# Patient Record
Sex: Female | Born: 1995 | Race: Black or African American | Hispanic: No | Marital: Single | Smoking: Never smoker
Health system: Southern US, Community
[De-identification: ages and names within clinical notes are randomized; demographics above are authoritative.]

## PROBLEM LIST (undated history)

## (undated) DIAGNOSIS — J45909 Unspecified asthma, uncomplicated: Secondary | ICD-10-CM

---
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
## 2016-11-02 ENCOUNTER — Emergency Department (HOSPITAL_COMMUNITY)
Admission: EM | Admit: 2016-11-02 | Discharge: 2016-11-03 | Disposition: A | Discharge status: Home / Self Care | Payer: No Typology Code available for payment source | Attending: Emergency Medicine | Admitting: Emergency Medicine

## 2016-11-02 ENCOUNTER — Emergency Department (HOSPITAL_COMMUNITY): Payer: No Typology Code available for payment source

## 2016-11-02 ENCOUNTER — Encounter (HOSPITAL_COMMUNITY): Payer: Self-pay | Admitting: Emergency Medicine

## 2016-11-02 DIAGNOSIS — R509 Fever, unspecified: Secondary | ICD-10-CM | POA: Insufficient documentation

## 2016-11-02 DIAGNOSIS — Z79899 Other long term (current) drug therapy: Secondary | ICD-10-CM | POA: Insufficient documentation

## 2016-11-02 DIAGNOSIS — R69 Illness, unspecified: Secondary | ICD-10-CM

## 2016-11-02 DIAGNOSIS — R05 Cough: Secondary | ICD-10-CM

## 2016-11-02 DIAGNOSIS — R059 Cough, unspecified: Secondary | ICD-10-CM

## 2016-11-02 DIAGNOSIS — J45901 Unspecified asthma with (acute) exacerbation: Secondary | ICD-10-CM | POA: Insufficient documentation

## 2016-11-02 DIAGNOSIS — J111 Influenza due to unidentified influenza virus with other respiratory manifestations: Secondary | ICD-10-CM | POA: Insufficient documentation

## 2016-11-02 MED ORDER — KETOROLAC TROMETHAMINE 30 MG/ML IJ SOLN
30.0000 mg | Freq: Once | INTRAMUSCULAR | Status: AC
  Administered 2016-11-02: 30 mg via INTRAVENOUS
  Filled 2016-11-02: qty 1

## 2016-11-02 MED ORDER — LEVALBUTEROL HCL 1.25 MG/0.5ML IN NEBU
1.2500 mg | INHALATION_SOLUTION | Freq: Once | RESPIRATORY_TRACT | Status: AC
  Administered 2016-11-02: 1.25 mg via RESPIRATORY_TRACT
  Filled 2016-11-02: qty 0.5

## 2016-11-02 MED ORDER — SODIUM CHLORIDE 0.9 % IV BOLUS (SEPSIS)
1000.0000 mL | Freq: Once | INTRAVENOUS | Status: AC
  Administered 2016-11-02: 1000 mL via INTRAVENOUS

## 2016-11-02 MED ORDER — ALBUTEROL SULFATE HFA 108 (90 BASE) MCG/ACT IN AERS
2.0000 | INHALATION_SPRAY | Freq: Once | RESPIRATORY_TRACT | Status: AC
  Administered 2016-11-02: 2 via RESPIRATORY_TRACT
  Filled 2016-11-02: qty 6.7

## 2016-11-02 MED ORDER — ALBUTEROL SULFATE HFA 108 (90 BASE) MCG/ACT IN AERS: 2.0000 | INHALATION_SPRAY | RESPIRATORY_TRACT | 0 refills | Status: DC | PRN

## 2016-11-02 MED ORDER — IPRATROPIUM BROMIDE 0.02 % IN SOLN
0.5000 mg | Freq: Once | RESPIRATORY_TRACT | Status: AC
  Administered 2016-11-02: 0.5 mg via RESPIRATORY_TRACT
  Filled 2016-11-02: qty 2.5

## 2016-11-02 MED ORDER — ALBUTEROL SULFATE (2.5 MG/3ML) 0.083% IN NEBU
5.0000 mg | INHALATION_SOLUTION | Freq: Once | RESPIRATORY_TRACT | Status: DC
  Filled 2016-11-02: qty 6

## 2016-11-02 MED ORDER — OSELTAMIVIR PHOSPHATE 75 MG PO CAPS: 75.0000 mg | ORAL_CAPSULE | Freq: Two times a day (BID) | ORAL | 0 refills | Status: DC

## 2016-11-02 MED ORDER — PREDNISONE 20 MG PO TABS
60.0000 mg | ORAL_TABLET | Freq: Once | ORAL | Status: AC
  Administered 2016-11-02: 60 mg via ORAL
  Filled 2016-11-02: qty 3

## 2016-11-02 MED ORDER — PREDNISONE 20 MG PO TABS: ORAL_TABLET | ORAL | 0 refills | Status: DC

## 2016-11-02 MED ORDER — ACETAMINOPHEN 500 MG PO TABS
1000.0000 mg | ORAL_TABLET | Freq: Once | ORAL | Status: AC
  Administered 2016-11-02: 1000 mg via ORAL
  Filled 2016-11-02: qty 2

## 2016-11-02 NOTE — ED Notes (Signed)
Patient transported to X-ray 

## 2016-11-02 NOTE — Discharge Instructions (Signed)
Continue to stay well-hydrated. Continue to alternate between Tylenol and Ibuprofen for pain or fever. Use Mucinex for cough suppression/expectoration of mucus. Use netipot and flonase to help with nasal congestion. Take over-the-counter Benadryl or other daily antihistamine to decrease secretions and for help with your symptoms, and to help with your asthma exacerbation. Use inhaler as directed, as needed for cough/chest congestion/wheezing/shortness of breath. Take prednisone as directed until completed, starting 11/03/16 since you were given today's dose in the ER. Your symptoms are consistent with the flu virus, take tamiflu as directed until completed which can potentially help decrease severity and duration of symptoms if your illness is caused by the flu; if it is a different viral illness, tamiflu may not do much, but viral illnesses typically self resolve with time. Follow up with your primary care doctor in 3-5 days for recheck of ongoing symptoms. Return to emergency department for emergent changing or worsening of symptoms.

## 2016-11-02 NOTE — ED Provider Notes (Signed)
[facility]-EMERGENCY DEPT Provider Note   CSN: [id number] Arrival date & time: 11/02/16  2009     History   Chief Complaint Chief Complaint  Patient presents with  . Fever  . Shortness of Breath    HPI Debra Gardner is a 21 y.o. female with a PMHx of asthma, who presents to the ED with complaints of flulike symptoms that began yesterday, triggering an asthma exacerbation. Patient states that yesterday she developed fever with Tmax 103, as well as chest congestion and tightness, dry cough, generalized body aches, shortness of breath, and wheezing. She took Alka-Seltzer and Tylenol around noon which helped, then took 600 mg ibuprofen around 6 PM which also helped with her fever and body aches. She ran out of her inhalers yesterday so she was unable to use anything for her shortness of breath, and bruit she was given 10 mg albuterol and 0.5 mg Atrovent which she states helped significantly. No known aggravating factors. States that her symptoms feel consistent with her prior asthma exacerbations. Multiple +sick contacts at school. Did not receive the flu shot this year, was going to get it today at the campus clinic. She is a nonsmoker, no family hx of cardiac disease.  She denies rhinorrhea, sore throat, ear pain/drainage, hemoptysis, CP aside from tightness, LE swelling, recent travel/surg/immobilization, estrogen use, personal/family hx of DVT/PE, abd pain, N/V/D/C, hematuria, dysuria, numbness, tingling, focal weakness, diaphoresis, lightheadedness, claudication, orthopnea, or any other complaints at this time. She has previously needed steroids when she has asthma exacerbations.    The history is provided by the patient and medical records. No language interpreter was used.  Fever   Associated symptoms include cough. Pertinent negatives include no chest pain, no diarrhea, no vomiting and no sore throat.  Shortness of Breath  Associated symptoms include a fever (Tmax 103), cough and wheezing.  Pertinent negatives include no rhinorrhea, no sore throat, no ear pain, no chest pain, no vomiting, no abdominal pain and no leg swelling.  URI   This is a new problem. The current episode started yesterday. The problem has not changed since onset.The maximum temperature recorded prior to her arrival was 103 to 104 F. The fever has been present for less than 1 day. Associated symptoms include cough and wheezing. Pertinent negatives include no chest pain, no abdominal pain, no diarrhea, no nausea, no vomiting, no dysuria, no ear pain, no rhinorrhea and no sore throat. She has tried other medications (alka seltzer, tylenol, ibuprofen, and albuterol 10mg /Atrovent 0.5mg  en route) for the symptoms. The treatment provided moderate relief.    Past Medical History:  Diagnosis Date  . Asthma     There are no active problems to display for this patient.   No past surgical history on file.  OB History    No data available       Home Medications    Prior to Admission medications   Medication Sig Start Date End Date Taking? Authorizing Provider  albuterol (PROVENTIL HFA;VENTOLIN HFA) 108 (90 BASE) MCG/ACT inhaler Inhale 2 puffs into the lungs every 6 (six) hours as needed for wheezing or shortness of breath. 02/04/15   Jennifer Piepenbrink, PA-C  beclomethasone (QVAR) 40 MCG/ACT inhaler Inhale 1 puff into the lungs 2 (two) times daily. Patient taking differently: Inhale 2 puffs into the lungs 2 (two) times daily.  05/26/12 10/01/15  Tamika Bush, DO  benzonatate (TESSALON) 100 MG capsule Take 1 capsule (100 mg total) by mouth every 8 (eight) hours. 10/01/15   Shawn  C Joy, PA-C  cetirizine (ZYRTEC) 10 MG tablet Take 10 mg by mouth daily.    Historical Provider, MD  dextromethorphan-guaiFENesin (MUCINEX DM) 30-600 MG 12hr tablet Take 1 tablet by mouth 2 (two) times daily. 10/01/15   Shawn C Joy, PA-C  EPINEPHrine (EPIPEN 2-PAK) 0.3 mg/0.3 mL IJ SOAJ injection Inject 0.3 mLs (0.3 mg total) into the  muscle once as needed (for severe allergic reaction). CAll 911 immediately if you have to use this medicine 02/04/15   Francee Piccolo, PA-C  fluticasone (FLONASE) 50 MCG/ACT nasal spray Place 2 sprays into the nose daily. Patient taking differently: Place 2 sprays into the nose daily as needed for allergies.  05/26/12 10/01/15  Tamika Bush, DO  ibuprofen (ADVIL,MOTRIN) 800 MG tablet Take 1 tablet (800 mg total) by mouth 3 (three) times daily. 10/01/15   Shawn C Joy, PA-C  montelukast (SINGULAIR) 10 MG tablet Take 10 mg by mouth at bedtime.    Historical Provider, MD  ondansetron (ZOFRAN ODT) 4 MG disintegrating tablet Take 1 tablet (4 mg total) by mouth every 8 (eight) hours as needed for nausea or vomiting. 10/01/15   Anselm Pancoast, PA-C    Family History No family history on file.  Social History Social History  Substance Use Topics  . Smoking status: Never Smoker  . Smokeless tobacco: Not on file  . Alcohol use No     Allergies   Dairy aid [lactase] and Wheat bran   Review of Systems Review of Systems  Constitutional: Positive for fever (Tmax 103). Negative for diaphoresis.  HENT: Negative for ear discharge, ear pain, rhinorrhea and sore throat.   Respiratory: Positive for cough, chest tightness, shortness of breath and wheezing.   Cardiovascular: Negative for chest pain and leg swelling.  Gastrointestinal: Negative for abdominal pain, constipation, diarrhea, nausea and vomiting.  Genitourinary: Negative for dysuria and hematuria.  Musculoskeletal: Positive for myalgias (body aches). Negative for arthralgias.  Skin: Negative for color change.  Allergic/Immunologic: Negative for immunocompromised state.  Neurological: Negative for weakness, light-headedness and numbness.  Psychiatric/Behavioral: Negative for confusion.   10 Systems reviewed and are negative for acute change except as noted in the HPI.   Physical Exam Updated Vital Signs BP 124/62 (BP Location: Right Arm)    Pulse (!) 144   Temp 100.9 F (38.3 C) (Oral)   Resp 21   Ht 5\' 2"  (1.575 m)   Wt 72.6 kg   SpO2 95%   BMI 29.26 kg/m   Physical Exam  Constitutional: She is oriented to person, place, and time. She appears well-developed and well-nourished.  Non-toxic appearance. No distress.  Febrile 100.22F but overall nontoxic, NAD, fairly well appearing, not septic appearing  HENT:  Head: Normocephalic and atraumatic.  Mouth/Throat: Oropharynx is clear and moist and mucous membranes are normal.  Eyes: Conjunctivae and EOM are normal. Right eye exhibits no discharge. Left eye exhibits no discharge.  Neck: Normal range of motion. Neck supple.  Cardiovascular: Regular rhythm, normal heart sounds and intact distal pulses.  Tachycardia present.  Exam reveals no gallop and no friction rub.   No murmur heard. Tachycardic in the 140s, reg rhythm, nl s1/s2, no m/r/g, distal pulses intact, no pedal edema   Pulmonary/Chest: Effort normal. No respiratory distress. She has decreased breath sounds in the right lower field. She has no wheezes. She has no rhonchi. She has no rales. She exhibits tenderness. She exhibits no crepitus, no deformity and no retraction.  Slightly diminished lung sounds in RLL but  otherwise overall CTAB in all other lung fields, no w/r/r audible, no hypoxia or increased WOB, speaking in full sentences, SpO2 95% on RA Chest wall mildly TTP diffusely across anterior chest, without crepitus, deformities, or retractions   Abdominal: Soft. Normal appearance and bowel sounds are normal. She exhibits no distension. There is no tenderness. There is no rigidity, no rebound, no guarding, no CVA tenderness, no tenderness at McBurney's point and negative Murphy's sign.  Musculoskeletal: Normal range of motion.  MAE x4 Strength and sensation grossly intact in all extremities Distal pulses intact Gait steady No pedal edema, neg homan's bilaterally   Neurological: She is alert and oriented to person,  place, and time. She has normal strength. No sensory deficit.  Skin: Skin is warm, dry and intact. No rash noted.  Psychiatric: She has a normal mood and affect.  Nursing note and vitals reviewed.    ED Treatments / Results  Labs (all labs ordered are listed, but only abnormal results are displayed) Labs Reviewed - No data to display  EKG  EKG Interpretation  Date/Time:  Thursday November 02 2016 20:30:36 EST Ventricular Rate:  140 PR Interval:    QRS Duration: 78 QT Interval:  302 QTC Calculation: 461 R Axis:   55 Text Interpretation:  Sinus tachycardia Multiple ventricular premature complexes Low voltage, precordial leads Nonspecific repol abnormality, diffuse leads Confirmed by Ranae Palms  MD, DAVID ([id number]) on 11/02/2016 9:25:06 PM       Radiology Dg Chest 2 View  Result Date: 11/02/2016 CLINICAL DATA:  21 year old female with cough and fever. History of asthma. EXAM: CHEST  2 VIEW COMPARISON:  Chest radiograph dated 09/30/2015 FINDINGS: Two views of the chest demonstrate an area of increased linear density involving the right middle lobe, seen on the lateral projection. This may represent atelectatic changes versus an area of vascular crowding. No corresponding density noted on the frontal projection. There is no silhouetting of the diaphragm over the right cardiac border. The left lung is clear. There is no pleural effusion or pneumothorax. The cardiac silhouette is within normal limits. No acute osseous pathology. IMPRESSION: Linear density over the cardiac border on the lateral projection may represent subsegmental atelectasis in the right middle lobe. Infiltrate is less likely but not excluded. Clinical correlation is recommended. Electronically Signed   By: Elgie Collard M.D.   On: 11/02/2016 21:30    Procedures Procedures (including critical care time)  Medications Ordered in ED Medications  sodium chloride 0.9 % bolus 1,000 mL (0 mLs Intravenous Stopped 11/02/16 2334)    acetaminophen (TYLENOL) tablet 1,000 mg (1,000 mg Oral Given 11/02/16 2101)  ketorolac (TORADOL) 30 MG/ML injection 30 mg (30 mg Intravenous Given 11/02/16 2143)  ipratropium (ATROVENT) nebulizer solution 0.5 mg (0.5 mg Nebulization Given 11/02/16 2132)  predniSONE (DELTASONE) tablet 60 mg (60 mg Oral Given 11/02/16 2101)  levalbuterol (XOPENEX) nebulizer solution 1.25 mg (1.25 mg Nebulization Given 11/02/16 2131)  albuterol (PROVENTIL HFA;VENTOLIN HFA) 108 (90 Base) MCG/ACT inhaler 2 puff (2 puffs Inhalation Given 11/02/16 2239)  sodium chloride 0.9 % bolus 1,000 mL (0 mLs Intravenous Stopped 11/02/16 2335)     Initial Impression / Assessment and Plan / ED Course  I have reviewed the triage vital signs and the nursing notes.  Pertinent labs & imaging results that were available during my care of the patient were reviewed by me and considered in my medical decision making (see chart for details).     21 y.o. female here with flu like illness x1  day that has triggered an asthma exacerbation. Fever Tmax 103 along with dry cough, chest congestion and tightness, wheezing, body aches, and SOB beginning yesterday. +Sick contacts. Given 10mg  albuterol and 0.5mg  atrovent en route, likely contributing to her tachycardia; arrives mildly febrile 100.9, tachycardic in the 140s, but overall appears well, smiling and in NAD, DOES NOT APPEAR SEPTIC. Doubt PE as cause of tachycardia. Lung sounds actually better than anticipated, mildly diminished in RLL but otherwise no wheezing/rhonchi. Pt feels like she would do well with another duoneb (xopenex instead of albuterol), which I feel is reasonable. Will give fluids as well, in addition to tylenol 1000mg , toradol, and prednisone. EKG with tachycardia but no acute ischemic findings. Will obtain CXR to r/o PNA but doubt need for other emergent work up at this time, likely flu and will likely empirically treat for this. Will reassess after CXR and meds given.  10:30 PM HR  improving to 125-130s with fluids ~75% done. Lung sounds improved after duoneb and pt feeling better. Fever down after tylenol/toradol, and body aches improved. CXR showing linear density over cardiac border likely represents RML subsegmental atelectasis, infiltrate less likely but not completely excluded; overall, lung sounds mostly clear and symptoms sound like influenza, so doubt this area truly represents PNA. Given that she still has some mild tachycardia, will give second bolus of fluids, then recheck; as long as HR improving, then we can likely d/c home with empiric tamiflu, prednisone, and inhaler. Will give inhaler now so she has one to go home with. Will reassess after fluids.  11:55 PM Fluids finished, HR down to 110s after second liter bolus; pt continues to feel improved and is well appearing, feels well enough to go home. Will proceed with empiric tamiflu given pt's pulmonary disease putting her at higher risk of complications, and since she's in the window of potential benefit from tamiflu. Will also rx prednisone starting tomorrow x4 days, and inhaler. Advised OTC meds for symptom control, and importance of staying hydrated. F/up with PCP in 3-5 days for recheck. I explained the diagnosis and have given explicit precautions to return to the ER including for any other new or worsening symptoms. The patient understands and accepts the medical plan as it's been dictated and I have answered their questions. Discharge instructions concerning home care and prescriptions have been given. The patient is STABLE and is discharged to home in good condition.    Final Clinical Impressions(s) / ED Diagnoses   Final diagnoses:  Influenza-like illness  Exacerbation of asthma, unspecified asthma severity, unspecified whether persistent  Fever, unspecified fever cause  Cough    New Prescriptions New Prescriptions   ALBUTEROL (PROVENTIL HFA;VENTOLIN HFA) 108 (90 BASE) MCG/ACT INHALER    Inhale 2 puffs  into the lungs every 4 (four) hours as needed for wheezing or shortness of breath (cough).   OSELTAMIVIR (TAMIFLU) 75 MG CAPSULE    Take 1 capsule (75 mg total) by mouth every 12 (twelve) hours.   PREDNISONE (DELTASONE) 20 MG TABLET    3 tabs po daily x 4 days starting 11/03/16     Jetta LoutMercedes Strupp Andreea Arca, PA-C 11/02/16 2356    Loren Raceravid Yelverton, MD 11/03/16 [id number]

## 2016-11-02 NOTE — ED Notes (Signed)
ED Provider at bedside. 

## 2016-11-02 NOTE — ED Notes (Signed)
Pt returned from xray

## 2016-11-02 NOTE — ED Triage Notes (Signed)
Pt BIB EMS from home. [facility] [employer] [facility] [employer]. Has 1 day hx of general weakness, fever. Took ibuprofen at home, approx 1800 today. Max fever at home of 102F. Hx of asthma. Called EMS for SOB. EMS reported wheezing in all fields. Gave pt 10mg  albuterol, 0.5mg  atrovent via neb tx. EMS reports pt tachy on scene & throughout transit.

## 2016-11-03 NOTE — ED Notes (Signed)
Patient feeling better, ready for DC

## 2017-03-03 ENCOUNTER — Emergency Department (HOSPITAL_COMMUNITY)
Admission: EM | Admit: 2017-03-03 | Discharge: 2017-03-03 | Disposition: A | Discharge status: Home / Self Care | Payer: BLUE CROSS/BLUE SHIELD | Attending: Physician Assistant | Admitting: Physician Assistant

## 2017-03-03 ENCOUNTER — Encounter (HOSPITAL_COMMUNITY): Payer: Self-pay | Admitting: *Deleted

## 2017-03-03 DIAGNOSIS — Z79899 Other long term (current) drug therapy: Secondary | ICD-10-CM | POA: Insufficient documentation

## 2017-03-03 DIAGNOSIS — J45909 Unspecified asthma, uncomplicated: Secondary | ICD-10-CM | POA: Insufficient documentation

## 2017-03-03 DIAGNOSIS — R112 Nausea with vomiting, unspecified: Secondary | ICD-10-CM | POA: Diagnosis not present

## 2017-03-03 DIAGNOSIS — R197 Diarrhea, unspecified: Secondary | ICD-10-CM | POA: Insufficient documentation

## 2017-03-03 MED ORDER — FAMOTIDINE 20 MG PO TABS: 20.0000 mg | ORAL_TABLET | Freq: Every day | ORAL | 0 refills | Status: DC

## 2017-03-03 MED ORDER — ONDANSETRON HCL 4 MG PO TABS: 4.0000 mg | ORAL_TABLET | Freq: Three times a day (TID) | ORAL | 0 refills | Status: DC | PRN

## 2017-03-03 MED ORDER — ONDANSETRON 4 MG PO TBDP
8.0000 mg | ORAL_TABLET | Freq: Once | ORAL | Status: AC
  Administered 2017-03-03: 8 mg via ORAL
  Filled 2017-03-03: qty 2

## 2017-03-03 NOTE — ED Notes (Signed)
Pt given ginger ale and peanut butter and crackers per PO challenge.

## 2017-03-03 NOTE — Discharge Instructions (Signed)
Read the information below.  Use the prescribed medication as directed.  Please discuss all new medications with your pharmacist.  You may return to the Emergency Department at any time for worsening condition or any new symptoms that concern you.   If you develop high fevers, worsening abdominal pain, uncontrolled vomiting, or are unable to tolerate fluids by mouth, return to the ER for a recheck.  ° °

## 2017-03-03 NOTE — ED Provider Notes (Signed)
[facility]-EMERGENCY DEPT Provider Note   CSN: [id number] Arrival date & time: 03/03/17  1150     History   Chief Complaint Chief Complaint  Patient presents with  . Abdominal Pain    HPI Debra Gardner is a 21 y.o. female.  HPI   Patient p/w N/V/D, abdominal cramping, that began last night.  States she ate fast food at 7pm, no other new or abnormal foods.  At 1am she vomited, vomited twice more before 3am.  She also had a loose bowel movement.  Since that time she has had nausea but no vomiting, no further bowel movements.  She took pepto bismol and no longer has abdominal pain.   Does not occasional burning in her throat c/w prior reflux symptoms that occurs only at night, worse over the past few weeks.  Denies fevers, urinary or vaginal symptoms.  LMP 02/10/17 was normal and on time.  No known sick contacts.    Past Medical History:  Diagnosis Date  . Asthma     There are no active problems to display for this patient.   History reviewed. No pertinent surgical history.  OB History    No data available       Home Medications    Prior to Admission medications   Medication Sig Start Date End Date Taking? Authorizing Provider  albuterol (PROVENTIL HFA;VENTOLIN HFA) 108 (90 BASE) MCG/ACT inhaler Inhale 2 puffs into the lungs every 6 (six) hours as needed for wheezing or shortness of breath. 02/04/15   Piepenbrink, Victorino DikeJennifer, PA-C  albuterol (PROVENTIL HFA;VENTOLIN HFA) 108 (90 Base) MCG/ACT inhaler Inhale 2 puffs into the lungs every 4 (four) hours as needed for wheezing or shortness of breath (cough). 11/02/16   Street, Mercedes, PA-C  beclomethasone (QVAR) 40 MCG/ACT inhaler Inhale 1 puff into the lungs 2 (two) times daily. Patient taking differently: Inhale 2 puffs into the lungs 2 (two) times daily.  05/26/12 10/01/15  Truddie CocoBush, Tamika, DO  beclomethasone (QVAR) 80 MCG/ACT inhaler Inhale 2 puffs into the lungs 2 (two) times daily.    [provider]  benzonatate (TESSALON)  100 MG capsule Take 1 capsule (100 mg total) by mouth every 8 (eight) hours. Patient not taking: Reported on 11/02/2016 10/01/15   Anselm PancoastJoy, Shawn C, PA-C  dextromethorphan-guaiFENesin [facility](MUCINEX DM) 30-600 MG 12hr tablet Take 1 tablet by mouth 2 (two) times daily. Patient not taking: Reported on 11/02/2016 10/01/15   Anselm PancoastJoy, Shawn C, PA-C  EPINEPHrine (EPIPEN 2-PAK) 0.3 mg/0.3 mL IJ SOAJ injection Inject 0.3 mLs (0.3 mg total) into the muscle once as needed (for severe allergic reaction). CAll 911 immediately if you have to use this medicine 02/04/15   Piepenbrink, Victorino DikeJennifer, PA-C  famotidine (PEPCID) 20 MG tablet Take 1 tablet (20 mg total) by mouth daily. 03/03/17   Trixie DredgeWest, Chantry Headen, PA-C  fluticasone (FLONASE) 50 MCG/ACT nasal spray Place 2 sprays into the nose daily. Patient taking differently: Place 2 sprays into the nose daily as needed for allergies.  05/26/12 10/01/15  Truddie CocoBush, Tamika, DO  ibuprofen (ADVIL,MOTRIN) 800 MG tablet Take 1 tablet (800 mg total) by mouth 3 (three) times daily. Patient not taking: Reported on 11/02/2016 10/01/15   Joy, Shawn C, PA-C  ondansetron (ZOFRAN ODT) 4 MG disintegrating tablet Take 1 tablet (4 mg total) by mouth every 8 (eight) hours as needed for nausea or vomiting. Patient not taking: Reported on 11/02/2016 10/01/15   Joy, Ines BloomerShawn C, PA-C  ondansetron (ZOFRAN) 4 MG tablet Take 1 tablet (4 mg total) by  mouth every 8 (eight) hours as needed for nausea or vomiting. 03/03/17   Trixie Dredge, PA-C  oseltamivir (TAMIFLU) 75 MG capsule Take 1 capsule (75 mg total) by mouth every 12 (twelve) hours. 11/02/16   Street, Hines, PA-C  predniSONE (DELTASONE) 20 MG tablet 3 tabs po daily x 4 days starting 11/03/16 11/02/16   Street, [place], PA-C    Family History No family history on file.  Social History Social History  Substance Use Topics  . Smoking status: Never Smoker  . Smokeless tobacco: Never Used  . Alcohol use No     Allergies   Dairy aid [lactase] and Wheat bran   Review  of Systems Review of Systems  All other systems reviewed and are negative.    Physical Exam Updated Vital Signs BP 102/73 (BP Location: Left Arm)   Pulse 89   Temp 98.4 F (36.9 C) (Oral)   Resp 17   LMP 02/10/2017   SpO2 98%   Physical Exam  Constitutional: She appears well-developed and well-nourished. No distress.  HENT:  Head: Normocephalic and atraumatic.  Neck: Neck supple.  Cardiovascular: Normal rate and regular rhythm.   Pulmonary/Chest: Effort normal and breath sounds normal. No respiratory distress. She has no wheezes. She has no rales.  Abdominal: Soft. She exhibits no distension and no mass. There is no tenderness. There is no rebound and no guarding.  Neurological: She is alert.  Skin: She is not diaphoretic.  Nursing note and vitals reviewed.    ED Treatments / Results  Labs (all labs ordered are listed, but only abnormal results are displayed) Labs Reviewed - No data to display  EKG  EKG Interpretation None       Radiology No results found.  Procedures Procedures (including critical care time)  Medications Ordered in ED Medications  ondansetron (ZOFRAN-ODT) disintegrating tablet 8 mg (8 mg Oral Given 03/03/17 1255)     Initial Impression / Assessment and Plan / ED Course  I have reviewed the triage vital signs and the nursing notes.  Pertinent labs & imaging results that were available during my care of the patient were reviewed by me and considered in my medical decision making (see chart for details).     Afebrile, nontoxic patient with limited episode of N/V/D overnight and abdominal pain that also resolved.  Pt well appearing on exam, abdominal exam is benign, no tenderness.  Tolerating PO after PO zofran.  D/C home with prescriptions, PCP follow up.  Discussed result, findings, treatment, and follow up  with patient.  Pt given return precautions.  Pt verbalizes understanding and agrees with plan.       Final Clinical Impressions(s) /  ED Diagnoses   Final diagnoses:  Nausea vomiting and diarrhea    New Prescriptions Discharge Medication List as of 03/03/2017  1:51 PM    START taking these medications   Details  famotidine (PEPCID) 20 MG tablet Take 1 tablet (20 mg total) by mouth daily., Starting Sat 03/03/2017, Print    ondansetron (ZOFRAN) 4 MG tablet Take 1 tablet (4 mg total) by mouth every 8 (eight) hours as needed for nausea or vomiting., Starting Sat 03/03/2017, Print         Trixie Dredge, PA-C 03/03/17 1606    Abelino Derrick, MD 03/04/17 [phone]

## 2017-03-03 NOTE — ED Triage Notes (Addendum)
To ED for eval of abd pain, nausea, vomiting, and diarrhea that started last pm. States she just feels very nauseated today. No food intake today. No difficulty with urination. Pain is lower and generalized. Denies abnormal vaginal discharge. LMP 5/5

## 2017-03-03 NOTE — ED Notes (Signed)
Pt tolerated PO challenge without difficulty.  

## 2017-03-03 NOTE — ED Notes (Signed)
Pt A&Ox4, ambulatory at d/c with steady gait, NAD 

## 2017-07-28 ENCOUNTER — Encounter (HOSPITAL_COMMUNITY): Payer: Self-pay | Admitting: Emergency Medicine

## 2017-07-28 ENCOUNTER — Emergency Department (HOSPITAL_COMMUNITY): Payer: Medicaid Other

## 2017-07-28 DIAGNOSIS — R05 Cough: Secondary | ICD-10-CM | POA: Diagnosis present

## 2017-07-28 DIAGNOSIS — Z79899 Other long term (current) drug therapy: Secondary | ICD-10-CM | POA: Diagnosis not present

## 2017-07-28 DIAGNOSIS — J4541 Moderate persistent asthma with (acute) exacerbation: Secondary | ICD-10-CM | POA: Insufficient documentation

## 2017-07-28 LAB — BASIC METABOLIC PANEL
ANION GAP: 8 (ref 5–15)
BUN: 11 mg/dL (ref 6–20)
CALCIUM: 9.1 mg/dL (ref 8.9–10.3)
CO2: 25 mmol/L (ref 22–32)
Chloride: 104 mmol/L (ref 101–111)
Creatinine, Ser: 0.7 mg/dL (ref 0.44–1.00)
GLUCOSE: 91 mg/dL (ref 65–99)
Potassium: 3.7 mmol/L (ref 3.5–5.1)
Sodium: 137 mmol/L (ref 135–145)

## 2017-07-28 LAB — CBC
HCT: 38.5 % (ref 36.0–46.0)
Hemoglobin: 12.9 g/dL (ref 12.0–15.0)
MCH: 27.3 pg (ref 26.0–34.0)
MCHC: 33.5 g/dL (ref 30.0–36.0)
MCV: 81.6 fL (ref 78.0–100.0)
PLATELETS: 377 10*3/uL (ref 150–400)
RBC: 4.72 MIL/uL (ref 3.87–5.11)
RDW: 12.6 % (ref 11.5–15.5)
WBC: 11.8 10*3/uL — AB (ref 4.0–10.5)

## 2017-07-28 MED ORDER — ALBUTEROL SULFATE (2.5 MG/3ML) 0.083% IN NEBU
5.0000 mg | INHALATION_SOLUTION | Freq: Once | RESPIRATORY_TRACT | Status: AC
  Administered 2017-07-28: 5 mg via RESPIRATORY_TRACT

## 2017-07-28 MED ORDER — ALBUTEROL SULFATE (2.5 MG/3ML) 0.083% IN NEBU
INHALATION_SOLUTION | RESPIRATORY_TRACT | Status: AC
  Filled 2017-07-28: qty 6

## 2017-07-28 NOTE — ED Triage Notes (Signed)
Reports cough for over a week.  Trying OTC medication, dayquil and mucinex.  Also reports using qvar inhaler 3 X's a day instead of twice a day.  Not using albuterol as much trying to "save" inhaler.  Reports cough being very productive with thick greenish/yellow sputum.

## 2017-07-29 ENCOUNTER — Emergency Department (HOSPITAL_COMMUNITY)
Admission: EM | Admit: 2017-07-29 | Discharge: 2017-07-29 | Disposition: A | Discharge status: Home / Self Care | Payer: Medicaid Other | Attending: Emergency Medicine | Admitting: Emergency Medicine

## 2017-07-29 DIAGNOSIS — J45901 Unspecified asthma with (acute) exacerbation: Secondary | ICD-10-CM

## 2017-07-29 MED ORDER — BENZONATATE 100 MG PO CAPS: 100.0000 mg | ORAL_CAPSULE | Freq: Three times a day (TID) | ORAL | 0 refills | Status: DC

## 2017-07-29 MED ORDER — PREDNISONE 10 MG PO TABS: 40.0000 mg | ORAL_TABLET | Freq: Every day | ORAL | 0 refills | Status: AC

## 2017-07-29 MED ORDER — AZITHROMYCIN 250 MG PO TABS: 250.0000 mg | ORAL_TABLET | Freq: Every day | ORAL | 0 refills | Status: DC

## 2017-07-29 MED ORDER — ALBUTEROL SULFATE HFA 108 (90 BASE) MCG/ACT IN AERS: 1.0000 | INHALATION_SPRAY | Freq: Four times a day (QID) | RESPIRATORY_TRACT | 0 refills | Status: DC | PRN

## 2017-07-29 NOTE — ED Provider Notes (Signed)
[facility] EMERGENCY DEPARTMENT Provider Note   CSN: [id number] Arrival date & time: 07/28/17  2026     History   Chief Complaint Chief Complaint  Patient presents with  . Cough    HPI Debra Gardner is a 21 y.o. female the past medical history of asthma, who presents to ED for evaluation of 10 day history of productive cough. She states that she is coughing up yellowish whitish sputum. She does have a history of similar symptoms in the past when she gets her asthma exacerbations and allergies states that her symptoms do not usually last this long. She has tried DayQuil and NyQuil with no relief in her symptoms. She has been taking her Qvar inhaler 3 times a day with mild relief in her symptoms. She denies any recent usage of her albuterol inhaler.  She also reports rhinorrhea, subjective fever. She denies any chest pain, wheezing, abdominal pain, nausea, vomiting. She does have sick contacts with similar symptoms.  HPI  Past Medical History:  Diagnosis Date  . Asthma     There are no active problems to display for this patient.   History reviewed. No pertinent surgical history.  OB History    No data available       Home Medications    Prior to Admission medications   Medication Sig Start Date End Date Taking? Authorizing Provider  albuterol (PROVENTIL HFA;VENTOLIN HFA) 108 (90 Base) MCG/ACT inhaler Inhale 1-2 puffs into the lungs every 6 (six) hours as needed for wheezing or shortness of breath. 07/29/17   Moises Terpstra, PA-C  azithromycin (ZITHROMAX Z-PAK) 250 MG tablet Take 1 tablet (250 mg total) by mouth daily. Take 500mg  on day 1, take 250mg  on days 2-5 07/29/17   Legend Pecore, PA-C  beclomethasone (QVAR) 40 MCG/ACT inhaler Inhale 1 puff into the lungs 2 (two) times daily. Patient taking differently: Inhale 2 puffs into the lungs 2 (two) times daily.  05/26/12 10/01/15  Truddie Coco, DO  beclomethasone (QVAR) 80 MCG/ACT inhaler Inhale 2 puffs into  the lungs 2 (two) times daily.    [provider]  benzonatate (TESSALON) 100 MG capsule Take 1 capsule (100 mg total) by mouth every 8 (eight) hours. 07/29/17   Dioselina Brumbaugh, PA-C  dextromethorphan-guaiFENesin (MUCINEX DM) 30-600 MG 12hr tablet Take 1 tablet by mouth 2 (two) times daily. Patient not taking: Reported on 11/02/2016 10/01/15   Anselm Pancoast, PA-C  EPINEPHrine (EPIPEN 2-PAK) 0.3 mg/0.3 mL IJ SOAJ injection Inject 0.3 mLs (0.3 mg total) into the muscle once as needed (for severe allergic reaction). CAll 911 immediately if you have to use this medicine 02/04/15   Piepenbrink, Victorino Dike, PA-C  famotidine (PEPCID) 20 MG tablet Take 1 tablet (20 mg total) by mouth daily. 03/03/17   Trixie Dredge, PA-C  fluticasone (FLONASE) 50 MCG/ACT nasal spray Place 2 sprays into the nose daily. Patient taking differently: Place 2 sprays into the nose daily as needed for allergies.  05/26/12 10/01/15  Truddie Coco, DO  ibuprofen (ADVIL,MOTRIN) 800 MG tablet Take 1 tablet (800 mg total) by mouth 3 (three) times daily. Patient not taking: Reported on 11/02/2016 10/01/15   Joy, Shawn C, PA-C  ondansetron (ZOFRAN ODT) 4 MG disintegrating tablet Take 1 tablet (4 mg total) by mouth every 8 (eight) hours as needed for nausea or vomiting. Patient not taking: Reported on 11/02/2016 10/01/15   Joy, Ines Bloomer C, PA-C  ondansetron (ZOFRAN) 4 MG tablet Take 1 tablet (4 mg total) by mouth  every 8 (eight) hours as needed for nausea or vomiting. 03/03/17   Trixie DredgeWest, Emily, PA-C  oseltamivir (TAMIFLU) 75 MG capsule Take 1 capsule (75 mg total) by mouth every 12 (twelve) hours. 11/02/16   Street, Mercedes, PA-C  predniSONE (DELTASONE) 10 MG tablet Take 4 tablets (40 mg total) by mouth daily. 07/29/17 08/03/17  Dietrich PatesKhatri, Aidenjames Heckmann, PA-C    Family History No family history on file.  Social History Social History  Substance Use Topics  . Smoking status: Never Smoker  . Smokeless tobacco: Never Used  . Alcohol use No     Allergies    Dairy aid [lactase] and Wheat bran   Review of Systems Review of Systems  Constitutional: Negative for appetite change, chills and fever.  HENT: Positive for congestion and rhinorrhea. Negative for ear pain, sneezing and sore throat.   Eyes: Negative for photophobia and visual disturbance.  Respiratory: Positive for cough. Negative for chest tightness, shortness of breath and wheezing.   Cardiovascular: Negative for chest pain and palpitations.  Gastrointestinal: Negative for abdominal pain, blood in stool, constipation, diarrhea, nausea and vomiting.  Genitourinary: Negative for dysuria, hematuria and urgency.  Musculoskeletal: Negative for myalgias.  Skin: Negative for rash.  Neurological: Negative for dizziness, weakness and light-headedness.     Physical Exam Updated Vital Signs BP 122/79   Pulse 88   Temp 98.3 F (36.8 C) (Oral)   Resp 17   Ht 5\' 2"  (1.575 m)   Wt 78 kg (172 lb)   LMP 07/16/2017   SpO2 99%   BMI 31.46 kg/m   Physical Exam  Constitutional: She appears well-developed and well-nourished. No distress.  Nontoxic appearing and in no acute distress. Speaking in complete sentences with no signs of compromise or respiratory distress.  HENT:  Head: Normocephalic and atraumatic.  Nose: Nose normal.  Eyes: Conjunctivae and EOM are normal. Left eye exhibits no discharge. No scleral icterus.  Neck: Normal range of motion. Neck supple.  Cardiovascular: Normal rate, regular rhythm, normal heart sounds and intact distal pulses.  Exam reveals no gallop and no friction rub.   No murmur heard. Pulmonary/Chest: Effort normal and breath sounds normal. No respiratory distress.  No wheezing after breathing treatment given.  Abdominal: Soft. Bowel sounds are normal. She exhibits no distension. There is no tenderness. There is no guarding.  Musculoskeletal: Normal range of motion. She exhibits no edema.  Neurological: She is alert. She exhibits normal muscle tone.  Coordination normal.  Skin: Skin is warm and dry. No rash noted.  Psychiatric: She has a normal mood and affect.  Nursing note and vitals reviewed.    ED Treatments / Results  Labs (all labs ordered are listed, but only abnormal results are displayed) Labs Reviewed  CBC - Abnormal; Notable for the following:       Result Value   WBC 11.8 (*)    All other components within normal limits  BASIC METABOLIC PANEL    EKG  EKG Interpretation None       Radiology Dg Chest 2 View  Result Date: 07/28/2017 CLINICAL DATA:  Cough and shortness of breath for over a week. EXAM: CHEST  2 VIEW COMPARISON:  11/02/2016 FINDINGS: Slightly shallow inspiration. Normal heart size and pulmonary vascularity. No focal airspace disease or consolidation in the lungs. No blunting of costophrenic angles. No pneumothorax. Mediastinal contours appear intact. IMPRESSION: No active cardiopulmonary disease. Electronically Signed   By: Burman NievesWilliam  Stevens M.D.   On: 07/28/2017 21:34    Procedures Procedures (  including critical care time)  Medications Ordered in ED Medications  albuterol (PROVENTIL) (2.5 MG/3ML) 0.083% nebulizer solution (not administered)  albuterol (PROVENTIL) (2.5 MG/3ML) 0.083% nebulizer solution 5 mg (5 mg Nebulization Given 07/28/17 2047)     Initial Impression / Assessment and Plan / ED Course  I have reviewed the triage vital signs and the nursing notes.  Pertinent labs & imaging results that were available during my care of the patient were reviewed by me and considered in my medical decision making (see chart for details).     Patient, with a past medical history of asthma, presents to ED for evaluation of productive cough for the past 10 days. She also reports subjective fever.cough is productive with yellowish mucus but no hemoptysis noted. She has used her Qvar inhaler with mild relief in her symptoms. Physical exam patient is nontoxic-appearing and in no acute distress. She is  afebrile. Her lungs are clear to auscultation bilaterally after she was given a breathing treatment before my initial evaluation. She reports much improvement in her symptoms with breathing treatment. She is speaking in complete sentences with no signs of airway compromise or respiratory distress. She is satting at 99% on room air. Heart rate normal. She denies any chest pain. CBC with mild leukocytosis at 11.8. BMP unremarkable. X-ray of the chest negative for acute abnormality. I suspect that her symptoms could be due to bronchitis as well as an exacerbation of her asthma. Will be given antibiotics, prednisone, refill for inhaler and Tessalon Perles to be taken as needed. Advised to follow-up with PCP for further evaluation. I have low suspicion for cardiac or other pulmonary cause of her cough based on today's examination and her history. Patient appears stable for discharge at this time. Strict return precautions given.  Final Clinical Impressions(s) / ED Diagnoses   Final diagnoses:  Moderate asthma with exacerbation, unspecified whether persistent    New Prescriptions New Prescriptions   ALBUTEROL (PROVENTIL HFA;VENTOLIN HFA) 108 (90 BASE) MCG/ACT INHALER    Inhale 1-2 puffs into the lungs every 6 (six) hours as needed for wheezing or shortness of breath.   AZITHROMYCIN (ZITHROMAX Z-PAK) 250 MG TABLET    Take 1 tablet (250 mg total) by mouth daily. Take 500mg  on day 1, take 250mg  on days 2-5   BENZONATATE (TESSALON) 100 MG CAPSULE    Take 1 capsule (100 mg total) by mouth every 8 (eight) hours.   PREDNISONE (DELTASONE) 10 MG TABLET    Take 4 tablets (40 mg total) by mouth daily.     Dietrich Pates, PA-C 07/29/17 Loralie Champagne, MD 07/29/17 [phone]

## 2017-07-29 NOTE — ED Notes (Signed)
Patient denies pain and is resting comfortably.  

## 2017-07-29 NOTE — Discharge Instructions (Signed)
Please read attached information regarding your condition. Take Tessalon Perles and albuterol as needed for cough and wheezing. Take prednisone once daily for 5 days. Take azithromycin as directed. [facility] for further [facility]. Return to ED for worsening cough, chest pain, trouble breathing, wheezing, coughing up blood.

## 2018-06-05 ENCOUNTER — Emergency Department (HOSPITAL_COMMUNITY)
Admission: EM | Admit: 2018-06-05 | Discharge: 2018-06-05 | Discharge status: Against Medical Advice | Payer: Medicaid Other

## 2018-06-05 NOTE — ED Notes (Signed)
Pt called x3 to be triaged. No answer

## 2018-06-07 ENCOUNTER — Other Ambulatory Visit: Payer: Self-pay

## 2018-06-07 ENCOUNTER — Emergency Department (HOSPITAL_COMMUNITY): Payer: Medicaid Other

## 2018-06-07 ENCOUNTER — Emergency Department (HOSPITAL_COMMUNITY)
Admission: EM | Admit: 2018-06-07 | Discharge: 2018-06-07 | Disposition: A | Discharge status: Home / Self Care | Payer: Medicaid Other | Attending: Emergency Medicine | Admitting: Emergency Medicine

## 2018-06-07 ENCOUNTER — Encounter (HOSPITAL_COMMUNITY): Payer: Self-pay | Admitting: Emergency Medicine

## 2018-06-07 DIAGNOSIS — R062 Wheezing: Secondary | ICD-10-CM | POA: Insufficient documentation

## 2018-06-07 DIAGNOSIS — J069 Acute upper respiratory infection, unspecified: Secondary | ICD-10-CM | POA: Insufficient documentation

## 2018-06-07 DIAGNOSIS — Z79899 Other long term (current) drug therapy: Secondary | ICD-10-CM | POA: Insufficient documentation

## 2018-06-07 MED ORDER — PREDNISONE 20 MG PO TABS: 40.0000 mg | ORAL_TABLET | Freq: Every day | ORAL | 0 refills | Status: DC

## 2018-06-07 MED ORDER — ONDANSETRON 4 MG PO TBDP
4.0000 mg | ORAL_TABLET | Freq: Once | ORAL | Status: AC
  Administered 2018-06-07: 4 mg via ORAL
  Filled 2018-06-07: qty 1

## 2018-06-07 MED ORDER — ALBUTEROL SULFATE (2.5 MG/3ML) 0.083% IN NEBU
5.0000 mg | INHALATION_SOLUTION | Freq: Once | RESPIRATORY_TRACT | Status: AC
  Administered 2018-06-07: 5 mg via RESPIRATORY_TRACT
  Filled 2018-06-07: qty 6

## 2018-06-07 MED ORDER — ALBUTEROL SULFATE HFA 108 (90 BASE) MCG/ACT IN AERS
2.0000 | INHALATION_SPRAY | RESPIRATORY_TRACT | Status: DC | PRN
  Administered 2018-06-07: 2 via RESPIRATORY_TRACT
  Filled 2018-06-07: qty 6.7

## 2018-06-07 MED ORDER — PREDNISONE 20 MG PO TABS
60.0000 mg | ORAL_TABLET | Freq: Once | ORAL | Status: AC
  Administered 2018-06-07: 60 mg via ORAL
  Filled 2018-06-07: qty 3

## 2018-06-07 MED ORDER — IPRATROPIUM BROMIDE 0.02 % IN SOLN
0.5000 mg | Freq: Once | RESPIRATORY_TRACT | Status: AC
  Administered 2018-06-07: 0.5 mg via RESPIRATORY_TRACT
  Filled 2018-06-07: qty 2.5

## 2018-06-07 MED ORDER — IPRATROPIUM-ALBUTEROL 0.5-2.5 (3) MG/3ML IN SOLN
3.0000 mL | Freq: Once | RESPIRATORY_TRACT | Status: AC
Start: 2018-06-07 — End: 2018-06-07
  Administered 2018-06-07: 3 mL via RESPIRATORY_TRACT
  Filled 2018-06-07: qty 3

## 2018-06-07 NOTE — ED Triage Notes (Signed)
Onset one week ago developed wheezing and have been using her inhaler with some relief however symptoms worsening with coughing and then vomits because coughing so hard.

## 2018-06-07 NOTE — Discharge Instructions (Signed)
Please read and follow all provided instructions.  Your diagnoses today include:  1. URI with cough and congestion     Tests performed today include:  Chest x-ray - does not show any pneumonia  Vital signs. See below for your results today.   Medications prescribed:   Albuterol inhaler - medication that opens up your airway  Use inhaler as follows: 1-2 puffs with spacer every 4 hours as needed for wheezing, cough, or shortness of breath.   Take any prescribed medications only as directed.  Home care instructions:  Follow any educational materials contained in this packet.  Follow-up instructions: Please follow-up with your primary care provider in the next 7 days for further evaluation of your symptoms and a recheck if you are not feeling better.   Return instructions:   Please return to the Emergency Department if you experience worsening symptoms.  Please return with worsening wheezing, shortness of breath, or difficulty breathing.  Return with persistent fever above 101F.   Please return if you have any other emergent concerns.  Additional Information:  Your vital signs today were: BP 121/85 (BP Location: Right Arm)    Pulse (!) 120    Temp 98.7 F (37.1 C) (Oral)    Resp (!) 24    Ht 5\' 2"  (1.575 m)    Wt 78.5 kg    SpO2 100%    BMI 31.64 kg/m  If your blood pressure (BP) was elevated above 135/85 this visit, please have this repeated by your doctor within one month. --------------

## 2018-06-07 NOTE — ED Notes (Signed)
Patient verbalizes understanding of discharge instructions. Opportunity for questioning and answers were provided. Armband removed by staff, pt discharged from ED.  

## 2018-06-07 NOTE — ED Notes (Signed)
Patient verbalizes understanding of discharge instructions. Opportunity for questioning and answers were provided. Ambulatory at discharge in NAD.  

## 2018-06-07 NOTE — ED Provider Notes (Signed)
[facility] Provider Note   CSN: [id number] Arrival date & time: 06/07/18  1437     History   Chief Complaint Chief Complaint  Patient presents with  . Asthma    HPI Debra FothergillOlivia J Gardner is a 22 y.o. female.  Patient with history of asthma presents the emergency department with complaints of 3-day history of fever to 102F, sore throat, cough, wheezing.  She has had some posttussive emesis.  No headache or ear pain.  No skin rashes or tick bites reported.  She has been using her albuterol inhaler multiple times at home and is nearly out.  No abdominal pain or diarrhea reported.  No urinary symptoms.  Patient was given a DuoNeb, prednisone, and Zofran upon arrival. The onset of this condition was acute.  Sore throat has improved since onset.  The course is constant. Aggravating factors: none. Alleviating factors: none.        Past Medical History:  Diagnosis Date  . Asthma     There are no active problems to display for this patient.   History reviewed. No pertinent surgical history.   OB History   None      Home Medications    Prior to Admission medications   Medication Sig Start Date End Date Taking? Authorizing Provider  albuterol (PROVENTIL HFA;VENTOLIN HFA) 108 (90 Base) MCG/ACT inhaler Inhale 1-2 puffs into the lungs every 6 (six) hours as needed for wheezing or shortness of breath. 07/29/17   Khatri, Hina, PA-C  azithromycin (ZITHROMAX Z-PAK) 250 MG tablet Take 1 tablet (250 mg total) by mouth daily. Take 500mg  on day 1, take 250mg  on days 2-5 07/29/17   Khatri, Hina, PA-C  beclomethasone (QVAR) 40 MCG/ACT inhaler Inhale 1 puff into the lungs 2 (two) times daily. Patient taking differently: Inhale 2 puffs into the lungs 2 (two) times daily.  05/26/12 10/01/15  Truddie CocoBush, Tamika, DO  beclomethasone (QVAR) 80 MCG/ACT inhaler Inhale 2 puffs into the lungs 2 (two) times daily.    [provider]  benzonatate (TESSALON) 100 MG  capsule Take 1 capsule (100 mg total) by mouth every 8 (eight) hours. 07/29/17   Khatri, Hina, PA-C  dextromethorphan-guaiFENesin (MUCINEX DM) 30-600 MG 12hr tablet Take 1 tablet by mouth 2 (two) times daily. Patient not taking: Reported on 11/02/2016 10/01/15   Anselm PancoastJoy, Shawn C, PA-C  EPINEPHrine (EPIPEN 2-PAK) 0.3 mg/0.3 mL IJ SOAJ injection Inject 0.3 mLs (0.3 mg total) into the muscle once as needed (for severe allergic reaction). CAll 911 immediately if you have to use this medicine 02/04/15   Piepenbrink, Victorino DikeJennifer, PA-C  famotidine (PEPCID) 20 MG tablet Take 1 tablet (20 mg total) by mouth daily. 03/03/17   Trixie DredgeWest, Emily, PA-C  fluticasone (FLONASE) 50 MCG/ACT nasal spray Place 2 sprays into the nose daily. Patient taking differently: Place 2 sprays into the nose daily as needed for allergies.  05/26/12 10/01/15  Truddie CocoBush, Tamika, DO  ibuprofen (ADVIL,MOTRIN) 800 MG tablet Take 1 tablet (800 mg total) by mouth 3 (three) times daily. Patient not taking: Reported on 11/02/2016 10/01/15   Joy, Shawn C, PA-C  ondansetron (ZOFRAN ODT) 4 MG disintegrating tablet Take 1 tablet (4 mg total) by mouth every 8 (eight) hours as needed for nausea or vomiting. Patient not taking: Reported on 11/02/2016 10/01/15   Harolyn RutherfordJoy, Shawn C, PA-C  ondansetron (ZOFRAN) 4 MG tablet Take 1 tablet (4 mg total) by mouth every 8 (eight) hours as needed for nausea or vomiting. 03/03/17  Trixie Dredge, PA-C  oseltamivir (TAMIFLU) 75 MG capsule Take 1 capsule (75 mg total) by mouth every 12 (twelve) hours. 11/02/16   Street, [place], PA-C    Family History No family history on file.  Social History Social History   Tobacco Use  . Smoking status: Never Smoker  . Smokeless tobacco: Never Used  Substance Use Topics  . Alcohol use: No  . Drug use: No     Allergies   Dairy aid [lactase] and Wheat bran   Review of Systems Review of Systems  Constitutional: Positive for chills, fatigue and fever.  HENT: Positive for congestion and sore  throat. Negative for ear pain, rhinorrhea and sinus pressure.   Eyes: Negative for redness.  Respiratory: Positive for cough and wheezing.   Gastrointestinal: Positive for nausea and vomiting. Negative for abdominal pain and diarrhea.  Genitourinary: Negative for dysuria.  Musculoskeletal: Positive for myalgias. Negative for neck stiffness.  Skin: Negative for rash.  Neurological: Negative for headaches.  Hematological: Negative for adenopathy.     Physical Exam Updated Vital Signs BP 121/85 (BP Location: Right Arm)   Pulse (!) 120   Temp 98.7 F (37.1 C) (Oral)   Resp (!) 24   Ht 5\' 2"  (1.575 m)   Wt 78.5 kg   SpO2 100%   BMI 31.64 kg/m   Physical Exam  Constitutional: She appears well-developed and well-nourished.  HENT:  Head: Normocephalic and atraumatic.  Right Ear: Tympanic membrane, external ear and ear canal normal.  Left Ear: Tympanic membrane, external ear and ear canal normal.  Nose: Nose normal. No mucosal edema or rhinorrhea.  Mouth/Throat: Uvula is midline and mucous membranes are normal. Mucous membranes are not dry. No oral lesions. No trismus in the jaw. No uvula swelling. Posterior oropharyngeal erythema present. No oropharyngeal exudate, posterior oropharyngeal edema or tonsillar abscesses.  Eyes: Conjunctivae are normal. Right eye exhibits no discharge. Left eye exhibits no discharge.  Neck: Normal range of motion. Neck supple.  Cardiovascular: Normal rate, regular rhythm and normal heart sounds.  Pulmonary/Chest: Effort normal. No respiratory distress. She has no wheezes. She has rhonchi (Scattered). She has no rales.  Abdominal: Soft. There is no tenderness.  Lymphadenopathy:    She has no cervical adenopathy.  Neurological: She is alert.  Skin: Skin is warm and dry.  Psychiatric: She has a normal mood and affect.  Nursing note and vitals reviewed.    ED Treatments / Results  Labs (all labs ordered are listed, but only abnormal results are  displayed) Labs Reviewed - No data to display  EKG None  Radiology Dg Chest 2 View  Result Date: 06/07/2018 CLINICAL DATA:  Fever, cough, chest pain, and congestion for the past 2-3 days. EXAM: CHEST - 2 VIEW COMPARISON:  Chest x-ray dated July 28, 2017. FINDINGS: The heart size and mediastinal contours are within normal limits. Both lungs are clear. The visualized skeletal structures are unremarkable. IMPRESSION: No active cardiopulmonary disease. Electronically Signed   By: Obie Dredge M.D.   On: 06/07/2018 16:06    Procedures Procedures (including critical care time)  Medications Ordered in ED Medications  albuterol (PROVENTIL HFA;VENTOLIN HFA) 108 (90 Base) MCG/ACT inhaler 2 puff (2 puffs Inhalation Provided for home use 06/07/18 1816)  ipratropium-albuterol (DUONEB) 0.5-2.5 (3) MG/3ML nebulizer solution 3 mL (3 mLs Nebulization Given 06/07/18 1459)  ondansetron (ZOFRAN-ODT) disintegrating tablet 4 mg (4 mg Oral Given 06/07/18 1458)  predniSONE (DELTASONE) tablet 60 mg (60 mg Oral Given 06/07/18 1507)  albuterol (PROVENTIL) (  2.5 MG/3ML) 0.083% nebulizer solution 5 mg (5 mg Nebulization Given 06/07/18 1816)  ipratropium (ATROVENT) nebulizer solution 0.5 mg (0.5 mg Nebulization Given 06/07/18 1816)     Initial Impression / Assessment and Plan / ED Course  I have reviewed the triage vital signs and the nursing notes.  Pertinent labs & imaging results that were available during my care of the patient were reviewed by me and considered in my medical decision making (see chart for details).     Patient seen and examined. Work-up initiated.    Vital signs reviewed and are as follows: BP 121/85 (BP Location: Right Arm)   Pulse (!) 120   Temp 98.7 F (37.1 C) (Oral)   Resp (!) 24   Ht 5\' 2"  (1.575 m)   Wt 78.5 kg   SpO2 100%   BMI 31.64 kg/m   Patient updated on chest x-ray results.  Offered additional nebulizer treatment.  Patient would like another one prior to discharge.   Plan on discharge to home with albuterol inhaler, prednisone.  6:59 PM lungs are now more clear.  Patient is in no respiratory distress.  She appears well and states she is feeling better.  Home with plan as above.  Encouraged follow-up or return with worsening shortness of breath, trouble breathing, high persistent fevers, new symptoms or other concerns.  She verbalizes understanding agrees with plan.  Final Clinical Impressions(s) / ED Diagnoses   Final diagnoses:  URI with cough and congestion   Patient with constellation of viral upper respiratory and symptoms in setting of asthma.  Symptoms seem to have flared up the patient's asthma.  She is improved in the emergency department tonight after 2 breathing treatments.  No hypoxia.  Patient was tachycardic on arrival, improved.  No chest pain or lower extremity swelling to suggest DVT.  As patient is clinically improved, feel safe for discharge home at this time.  ED Discharge Orders         Ordered    predniSONE (DELTASONE) 20 MG tablet  Daily     06/07/18 1856           Renne Crigler, PA-C 06/07/18 1900    Sabas Sous, MD 06/07/18 2012

## 2018-06-07 NOTE — ED Provider Notes (Signed)
Patient placed in Quick Look pathway, seen and evaluated   Chief Complaint:  asthma  HPI:  Peggye FothergillOlivia J Westergaard is a 22 y.o. female who presents to the ED with sore throat, cough, wheezing. Patient reports the vomiting was the result of coughing.   ROS: Resp: cough, wheezing, shortness of breath  GI: vomited with cough only     Physical Exam:  BP 121/85 (BP Location: Right Arm)   Pulse (!) 120   Temp 98.7 F (37.1 C) (Oral)   Resp (!) 24   SpO2 100%    Gen: No distress  Neuro: Awake and Alert  Skin: Warm and dry  Resp: decreased breath sounds, expiratory wheezing  Heart: tachycarida Duoneb, prednisone,zofran   Initiation of care has begun. The patient has been counseled on the process, plan, and necessity for staying for the completion/evaluation, and the remainder of the medical screening examination    Janne Napoleoneese, Hope M, NP 06/07/18 1454    Arby BarrettePfeiffer, Marcy, MD 06/10/18 [phone]

## 2018-09-18 ENCOUNTER — Emergency Department (HOSPITAL_COMMUNITY)
Admission: EM | Admit: 2018-09-18 | Discharge: 2018-09-19 | Disposition: A | Discharge status: Home / Self Care | Payer: Medicaid Other | Attending: Emergency Medicine | Admitting: Emergency Medicine

## 2018-09-18 ENCOUNTER — Encounter (HOSPITAL_COMMUNITY): Payer: Self-pay | Admitting: Emergency Medicine

## 2018-09-18 ENCOUNTER — Other Ambulatory Visit: Payer: Self-pay

## 2018-09-18 DIAGNOSIS — Z79899 Other long term (current) drug therapy: Secondary | ICD-10-CM | POA: Insufficient documentation

## 2018-09-18 DIAGNOSIS — J45909 Unspecified asthma, uncomplicated: Secondary | ICD-10-CM | POA: Insufficient documentation

## 2018-09-18 DIAGNOSIS — J9801 Acute bronchospasm: Secondary | ICD-10-CM | POA: Insufficient documentation

## 2018-09-18 DIAGNOSIS — R6889 Other general symptoms and signs: Secondary | ICD-10-CM

## 2018-09-18 DIAGNOSIS — B349 Viral infection, unspecified: Secondary | ICD-10-CM | POA: Insufficient documentation

## 2018-09-18 NOTE — ED Triage Notes (Signed)
Pt c/o fever, generalized body aches and nasal congestion x 3 days.

## 2018-09-19 MED ORDER — ALBUTEROL SULFATE (2.5 MG/3ML) 0.083% IN NEBU
INHALATION_SOLUTION | RESPIRATORY_TRACT | Status: AC
  Filled 2018-09-19: qty 6

## 2018-09-19 MED ORDER — BENZONATATE 100 MG PO CAPS: 100.0000 mg | ORAL_CAPSULE | Freq: Three times a day (TID) | ORAL | 0 refills | Status: DC | PRN

## 2018-09-19 MED ORDER — DEXAMETHASONE 4 MG PO TABS
ORAL_TABLET | ORAL | Status: AC
  Filled 2018-09-19: qty 3

## 2018-09-19 MED ORDER — IPRATROPIUM BROMIDE 0.02 % IN SOLN
RESPIRATORY_TRACT | Status: AC
  Filled 2018-09-19: qty 2.5

## 2018-09-19 MED ORDER — IBUPROFEN 800 MG PO TABS
ORAL_TABLET | ORAL | Status: AC
  Filled 2018-09-19: qty 1

## 2018-09-19 MED ORDER — OSELTAMIVIR PHOSPHATE 75 MG PO CAPS: 75.0000 mg | ORAL_CAPSULE | Freq: Two times a day (BID) | ORAL | 0 refills | Status: DC

## 2018-09-19 MED ORDER — ONDANSETRON 4 MG PO TBDP: 4.0000 mg | ORAL_TABLET | Freq: Three times a day (TID) | ORAL | 0 refills | Status: DC | PRN

## 2018-09-19 NOTE — ED Provider Notes (Signed)
[facility] Provider Note   CSN: [id number] Arrival date & time: 09/18/18  2345     History   Chief Complaint Chief Complaint  Patient presents with  . Fever    HPI Debra Gardner is a 22 y.o. female.   22 year old female presents to the ED for flulike illness.  States that her roommates have been sick with similar symptoms.  She began with onset of symptoms 3 days ago.  Notes body aches aggravated by movement.  Also reports chills, cold sweats with fever of maximum temperature 102F.  Had one episode of vomiting on day 1 of illness.  Has not had any vomiting since.  Reports shortness of breath which she has been managing with her inhaler.  She has also been using ibuprofen and DayQuil.  Last took ibuprofen at 2100 tonight.  Did not receive her flu shot this year.  Denies chest pain, diarrhea.     Past Medical History:  Diagnosis Date  . Asthma     There are no active problems to display for this patient.   History reviewed. No pertinent surgical history.   OB History   No obstetric history on file.      Home Medications    Prior to Admission medications   Medication Sig Start Date End Date Taking? Authorizing Provider  albuterol (PROVENTIL HFA;VENTOLIN HFA) 108 (90 Base) MCG/ACT inhaler Inhale 1-2 puffs into the lungs every 6 (six) hours as needed for wheezing or shortness of breath. 07/29/17   Khatri, Hina, PA-C  azithromycin (ZITHROMAX Z-PAK) 250 MG tablet Take 1 tablet (250 mg total) by mouth daily. Take 500mg  on day 1, take 250mg  on days 2-5 07/29/17   Khatri, Hina, PA-C  beclomethasone (QVAR) 40 MCG/ACT inhaler Inhale 1 puff into the lungs 2 (two) times daily. Patient taking differently: Inhale 2 puffs into the lungs 2 (two) times daily.  05/26/12 10/01/15  Truddie CocoBush, Tamika, DO  beclomethasone (QVAR) 80 MCG/ACT inhaler Inhale 2 puffs into the lungs 2 (two) times daily.    [provider]  benzonatate (TESSALON) 100 MG  capsule Take 1 capsule (100 mg total) by mouth 3 (three) times daily as needed for cough. 09/19/18   Antony MaduraHumes, Sinaya [place], PA-C  dextromethorphan-guaiFENesin (MUCINEX DM) 30-600 MG 12hr tablet Take 1 tablet by mouth 2 (two) times daily. Patient not taking: Reported on 11/02/2016 10/01/15   Anselm PancoastJoy, Shawn C, PA-C  EPINEPHrine (EPIPEN 2-PAK) 0.3 mg/0.3 mL IJ SOAJ injection Inject 0.3 mLs (0.3 mg total) into the muscle once as needed (for severe allergic reaction). CAll 911 immediately if you have to use this medicine 02/04/15   Piepenbrink, Victorino DikeJennifer, PA-C  famotidine (PEPCID) 20 MG tablet Take 1 tablet (20 mg total) by mouth daily. 03/03/17   Trixie DredgeWest, Emily, PA-C  fluticasone (FLONASE) 50 MCG/ACT nasal spray Place 2 sprays into the nose daily. Patient taking differently: Place 2 sprays into the nose daily as needed for allergies.  05/26/12 10/01/15  Truddie CocoBush, Tamika, DO  ibuprofen (ADVIL,MOTRIN) 800 MG tablet Take 1 tablet (800 mg total) by mouth 3 (three) times daily. Patient not taking: Reported on 11/02/2016 10/01/15   Joy, Ines BloomerShawn C, PA-C  ondansetron (ZOFRAN ODT) 4 MG disintegrating tablet Take 1 tablet (4 mg total) by mouth every 8 (eight) hours as needed for nausea or vomiting. 09/19/18   Antony MaduraHumes, Lind Ausley, PA-C  ondansetron (ZOFRAN) 4 MG tablet Take 1 tablet (4 mg total) by mouth every 8 (eight) hours as needed for nausea or vomiting.  03/03/17   Trixie Dredge, PA-C  oseltamivir (TAMIFLU) 75 MG capsule Take 1 capsule (75 mg total) by mouth every 12 (twelve) hours. 09/19/18   Antony Madura, PA-C  predniSONE (DELTASONE) 20 MG tablet Take 2 tablets (40 mg total) by mouth daily. 06/07/18   Renne Crigler, PA-C    Family History No family history on file.  Social History Social History   Tobacco Use  . Smoking status: Never Smoker  . Smokeless tobacco: Never Used  Substance Use Topics  . Alcohol use: No  . Drug use: No     Allergies   Dairy aid [lactase] and Wheat bran   Review of Systems Review of Systems Ten  systems reviewed and are negative for acute change, except as noted in the HPI.    Physical Exam Updated Vital Signs BP 126/77 (BP Location: Right Arm)   Pulse 94   Temp 98.5 F (36.9 C) (Oral)   Resp 16   LMP 09/01/2018   SpO2 95%   Physical Exam Vitals signs and nursing note reviewed.  Constitutional:      General: She is not in acute distress.    Appearance: She is well-developed. She is not diaphoretic.     Comments: Nontoxic appearing and in NAD  HENT:     Head: Normocephalic and atraumatic.     Nose:     Comments: Audible nasal congestion. Eyes:     General: No scleral icterus.    Conjunctiva/sclera: Conjunctivae normal.  Neck:     Musculoskeletal: Normal range of motion.  Cardiovascular:     Rate and Rhythm: Normal rate and regular rhythm.     Pulses: Normal pulses.  Pulmonary:     Effort: Pulmonary effort is normal. No respiratory distress.     Comments: Mild expiratory, sighing wheeze. Chest expansion symmetric. No tachypnea or dyspnea. Musculoskeletal: Normal range of motion.  Skin:    General: Skin is warm and dry.     Coloration: Skin is not pale.     Findings: No erythema or rash.  Neurological:     Mental Status: She is alert and oriented to person, place, and time.     Comments: GCS 15. Patient moving all extremities.  Psychiatric:        Behavior: Behavior normal.      ED Treatments / Results  Labs (all labs ordered are listed, but only abnormal results are displayed) Labs Reviewed - No data to display  EKG None  Radiology No results found.  Procedures Procedures (including critical care time)  Medications Ordered in ED Medications  ibuprofen (ADVIL,MOTRIN) 800 MG tablet (has no administration in time range)  dexamethasone (DECADRON) 4 MG tablet (has no administration in time range)  ipratropium (ATROVENT) 0.02 % nebulizer solution (has no administration in time range)  albuterol (PROVENTIL) (2.5 MG/3ML) 0.083% nebulizer solution (has  no administration in time range)     Initial Impression / Assessment and Plan / ED Course  I have reviewed the triage vital signs and the nursing notes.  Pertinent labs & imaging results that were available during my care of the patient were reviewed by me and considered in my medical decision making (see chart for details).     Patient presenting for symptoms consistent with flulike illness x 3 days.  Endorses sick contacts with similar symptoms.  Had some mild wheezing on initial exam which resolved following DuoNeb treatment.  Was given Decadron.  Lungs clear on repeat assessment.  Given asthma history, will start on  Tamiflu.  Discussed risks versus benefits of this medication.  Also discharged with Tessalon and Zofran for symptomatic management.  Return precautions discussed and provided. Patient discharged in stable condition with no unaddressed concerns.   Final Clinical Impressions(s) / ED Diagnoses   Final diagnoses:  Flu-like symptoms  Acute bronchospasm due to viral infection    ED Discharge Orders         Ordered    oseltamivir (TAMIFLU) 75 MG capsule  Every 12 hours     09/19/18 0312    ondansetron (ZOFRAN ODT) 4 MG disintegrating tablet  Every 8 hours PRN     09/19/18 0312    benzonatate (TESSALON) 100 MG capsule  3 times daily PRN     09/19/18 0312           Antony Madura, PA-C 09/19/18 0602    Derwood Kaplan, MD 09/19/18 [phone]

## 2018-09-19 NOTE — Discharge Instructions (Addendum)
Take Tamiflu as prescribed until finished.  Continue 600 mg ibuprofen every 6 hours for management of body aches.  You may take 1000 mg Tylenol every 8 hours for fever or headache.  Use Tessalon as prescribed for cough and Zofran for nausea.  Continue to get plenty of rest and drink plenty of fluids to prevent dehydration.  Follow-up with a primary care doctor.

## 2018-10-07 ENCOUNTER — Other Ambulatory Visit: Payer: Self-pay

## 2018-10-07 ENCOUNTER — Emergency Department (HOSPITAL_COMMUNITY): Payer: Self-pay

## 2018-10-07 ENCOUNTER — Emergency Department (HOSPITAL_COMMUNITY)
Admission: EM | Admit: 2018-10-07 | Discharge: 2018-10-08 | Disposition: A | Discharge status: Home / Self Care | Payer: Self-pay | Attending: Emergency Medicine | Admitting: Emergency Medicine

## 2018-10-07 ENCOUNTER — Encounter (HOSPITAL_COMMUNITY): Payer: Self-pay

## 2018-10-07 DIAGNOSIS — J4521 Mild intermittent asthma with (acute) exacerbation: Secondary | ICD-10-CM | POA: Insufficient documentation

## 2018-10-07 DIAGNOSIS — Z79899 Other long term (current) drug therapy: Secondary | ICD-10-CM | POA: Insufficient documentation

## 2018-10-07 LAB — CBC WITH DIFFERENTIAL/PLATELET
Abs Immature Granulocytes: 0.02 10*3/uL (ref 0.00–0.07)
BASOS PCT: 0 %
Basophils Absolute: 0 10*3/uL (ref 0.0–0.1)
EOS ABS: 0.1 10*3/uL (ref 0.0–0.5)
Eosinophils Relative: 3 %
HCT: 38.9 % (ref 36.0–46.0)
Hemoglobin: 12.5 g/dL (ref 12.0–15.0)
Immature Granulocytes: 0 %
Lymphocytes Relative: 29 %
Lymphs Abs: 1.4 10*3/uL (ref 0.7–4.0)
MCH: 26.6 pg (ref 26.0–34.0)
MCHC: 32.1 g/dL (ref 30.0–36.0)
MCV: 82.8 fL (ref 80.0–100.0)
Monocytes Absolute: 1 10*3/uL (ref 0.1–1.0)
Monocytes Relative: 20 %
Neutro Abs: 2.4 10*3/uL (ref 1.7–7.7)
Neutrophils Relative %: 48 %
Platelets: 325 10*3/uL (ref 150–400)
RBC: 4.7 MIL/uL (ref 3.87–5.11)
RDW: 12.8 % (ref 11.5–15.5)
WBC: 5 10*3/uL (ref 4.0–10.5)
nRBC: 0 % (ref 0.0–0.2)

## 2018-10-07 LAB — CBG MONITORING, ED: Glucose-Capillary: 124 mg/dL — ABNORMAL HIGH (ref 70–99)

## 2018-10-07 LAB — I-STAT CG4 LACTIC ACID, ED: Lactic Acid, Venous: 1.79 mmol/L (ref 0.5–1.9)

## 2018-10-07 MED ORDER — ALBUTEROL (5 MG/ML) CONTINUOUS INHALATION SOLN
10.0000 mg/h | INHALATION_SOLUTION | Freq: Once | RESPIRATORY_TRACT | Status: AC
  Administered 2018-10-07: 10 mg/h via RESPIRATORY_TRACT
  Filled 2018-10-07: qty 20

## 2018-10-07 MED ORDER — MAGNESIUM SULFATE 2 GM/50ML IV SOLN
2.0000 g | Freq: Once | INTRAVENOUS | Status: AC
  Administered 2018-10-07: 2 g via INTRAVENOUS
  Filled 2018-10-07: qty 50

## 2018-10-07 MED ORDER — IPRATROPIUM BROMIDE 0.02 % IN SOLN
0.5000 mg | Freq: Once | RESPIRATORY_TRACT | Status: AC
  Administered 2018-10-07: 0.5 mg via RESPIRATORY_TRACT
  Filled 2018-10-07: qty 2.5

## 2018-10-07 NOTE — ED Provider Notes (Signed)
[facility]   CSN: [id number]  Arrival date & time: 10/07/18  2232     History   Chief Complaint No chief complaint on file.   HPI Debra Gardner is a 22 y.o. female.  Patient with history of asthma presents the emergency department with complaints of cough ongoing for approximately 1 week.  She was seen in the emergency department on 12/11 with similar symptoms.  Patient developed a fever today up to 102F at home.  Patient took a gram of Tylenol at approximately 9:00 PM.  She was noted to have significant wheezing on EMS arrival and she was given multiple albuterol treatments and Solu-Medrol IV.  Breathing improved however still with significant wheezing and increased work of breathing.  She has been tachycardic.  States that she has been admitted to the hospital for with breathing treatments.  She works at [employer] and states that several of her coworkers have been sick.  Onset of symptoms acute.  Course is worsening.  Nothing makes symptoms better or worse.     Past Medical History:  Diagnosis Date  . Asthma     There are no active problems to display for this patient.   No past surgical history on file.   OB History   No obstetric history on file.      Home Medications    Prior to Admission medications   Medication Sig Start Date End Date Taking? Authorizing Provider  albuterol (PROVENTIL HFA;VENTOLIN HFA) 108 (90 Base) MCG/ACT inhaler Inhale 1-2 puffs into the lungs every 6 (six) hours as needed for wheezing or shortness of breath. 07/29/17   Khatri, Hina, PA-C  azithromycin (ZITHROMAX Z-PAK) 250 MG tablet Take 1 tablet (250 mg total) by mouth daily. Take 500mg  on day 1, take 250mg  on days 2-5 07/29/17   Khatri, Hina, PA-C  beclomethasone (QVAR) 40 MCG/ACT inhaler Inhale 1 puff into the lungs 2 (two) times daily. Patient taking differently: Inhale 2 puffs into the lungs 2 (two) times daily.  05/26/12 10/01/15  Truddie CocoBush,  Tamika, DO  beclomethasone (QVAR) 80 MCG/ACT inhaler Inhale 2 puffs into the lungs 2 (two) times daily.    [provider]  benzonatate (TESSALON) 100 MG capsule Take 1 capsule (100 mg total) by mouth 3 (three) times daily as needed for cough. 09/19/18   Antony MaduraHumes, Kelly, PA-C  dextromethorphan-guaiFENesin (MUCINEX DM) 30-600 MG 12hr tablet Take 1 tablet by mouth 2 (two) times daily. Patient not taking: Reported on 11/02/2016 10/01/15   Anselm PancoastJoy, Shawn C, PA-C  EPINEPHrine (EPIPEN 2-PAK) 0.3 mg/0.3 mL IJ SOAJ injection Inject 0.3 mLs (0.3 mg total) into the muscle once as needed (for severe allergic reaction). CAll 911 immediately if you have to use this medicine 02/04/15   Piepenbrink, Victorino DikeJennifer, PA-C  famotidine (PEPCID) 20 MG tablet Take 1 tablet (20 mg total) by mouth daily. 03/03/17   Trixie DredgeWest, Emily, PA-C  fluticasone (FLONASE) 50 MCG/ACT nasal spray Place 2 sprays into the nose daily. Patient taking differently: Place 2 sprays into the nose daily as needed for allergies.  05/26/12 10/01/15  Truddie CocoBush, Tamika, DO  ibuprofen (ADVIL,MOTRIN) 800 MG tablet Take 1 tablet (800 mg total) by mouth 3 (three) times daily. Patient not taking: Reported on 11/02/2016 10/01/15   Harolyn RutherfordJoy, Shawn C, PA-C  ondansetron (ZOFRAN ODT) 4 MG disintegrating tablet Take 1 tablet (4 mg total) by mouth every 8 (eight) hours as needed for nausea or vomiting. 09/19/18   Antony MaduraHumes, Kelly, PA-C  ondansetron [facility](ZOFRAN)  4 MG tablet Take 1 tablet (4 mg total) by mouth every 8 (eight) hours as needed for nausea or vomiting. 03/03/17   Trixie DredgeWest, Emily, PA-C  oseltamivir (TAMIFLU) 75 MG capsule Take 1 capsule (75 mg total) by mouth every 12 (twelve) hours. 09/19/18   Antony MaduraHumes, Kelly, PA-C  predniSONE (DELTASONE) 20 MG tablet Take 2 tablets (40 mg total) by mouth daily. 06/07/18   Renne CriglerGeiple, Karolyna Bianchini, PA-C    Family History No family history on file.  Social History Social History   Tobacco Use  . Smoking status: Never Smoker  . Smokeless tobacco: Never Used    Substance Use Topics  . Alcohol use: No  . Drug use: No     Allergies   Dairy aid [lactase] and Wheat bran   Review of Systems Review of Systems  Constitutional: Positive for chills and fever.  HENT: Negative for rhinorrhea and sore throat.   Eyes: Negative for redness.  Respiratory: Positive for cough, shortness of breath and wheezing.   Cardiovascular: Negative for chest pain.  Gastrointestinal: Negative for abdominal pain, diarrhea, nausea and vomiting.  Genitourinary: Negative for dysuria.  Musculoskeletal: Negative for myalgias.  Skin: Negative for rash.  Neurological: Negative for headaches.     Physical Exam Updated Vital Signs BP (!) 144/77 (BP Location: Left Arm)   Pulse (!) 136   Temp 98.3 F (36.8 C) (Oral)   Resp (!) 50   SpO2 100%   Physical Exam Vitals signs and nursing note reviewed.  Constitutional:      Appearance: She is well-developed.  HENT:     Head: Normocephalic and atraumatic.  Eyes:     General:        Right eye: No discharge.        Left eye: No discharge.     Conjunctiva/sclera: Conjunctivae normal.  Neck:     Musculoskeletal: Normal range of motion and neck supple.  Cardiovascular:     Rate and Rhythm: Regular rhythm. Tachycardia present.     Heart sounds: Normal heart sounds.  Pulmonary:     Effort: Tachypnea and accessory muscle usage present.     Breath sounds: Wheezing present. No rhonchi or rales.  Abdominal:     Palpations: Abdomen is soft.     Tenderness: There is no abdominal tenderness.  Skin:    General: Skin is warm and dry.  Neurological:     Mental Status: She is alert.      ED Treatments / Results  Labs (all labs ordered are listed, but only abnormal results are displayed) Labs Reviewed  CBG MONITORING, ED - Abnormal; Notable for the following components:      Result Value   Glucose-Capillary 124 (*)    All other components within normal limits  CBC WITH DIFFERENTIAL/PLATELET  BASIC METABOLIC PANEL   INFLUENZA PANEL BY PCR (TYPE A & B)  I-STAT CG4 LACTIC ACID, ED    EKG None  Radiology No results found.  Procedures Procedures (including critical care time)  Medications Ordered in ED Medications  magnesium sulfate IVPB 2 g 50 mL (2 g Intravenous New Bag/Given 10/07/18 2301)  albuterol (PROVENTIL,VENTOLIN) solution continuous neb (10 mg/hr Nebulization Given 10/07/18 2304)  ipratropium (ATROVENT) nebulizer solution 0.5 mg (0.5 mg Nebulization Given 10/07/18 2305)     Initial Impression / Assessment and Plan / ED Course  I have reviewed the triage vital signs and the nursing notes.  Pertinent labs & imaging results that were available during my care of the patient were  reviewed by me and considered in my medical decision making (see chart for details).     Patient seen and examined. Work-up initiated. Medications ordered.  Will start patient on continuous renal nebulizer, give magnesium.  She has received Solu-Medrol prior to arrival.  Suspect that she will need admission.  Vital signs reviewed and are as follows: BP (!) 144/77 (BP Location: Left Arm)   Pulse (!) 136   Temp 98.3 F (36.8 C) (Oral)   Resp (!) 50   Ht 5\' 2"  (1.575 m)   Wt 77.1 kg   SpO2 100%   BMI 31.09 kg/m   11:48 PM Handoff to [employer] at shift change.  Patient appears much more comfortable with reduced wheezing and accessory muscle use while on continuous albuterol nebulizer.  Pending chest x-ray, flu swab, chemistry.  Will need reassessed after treatment.   Final Clinical Impressions(s) / ED Diagnoses   Final diagnoses:  None   Pending completion of treatment or workup.   ED Discharge Orders    None       Renne Crigler, Cordelia Poche 10/07/18 2353    Tilden Fossa, MD 10/07/18 [phone]

## 2018-10-07 NOTE — ED Notes (Signed)
Bed: WA04 Expected date:  Expected time:  Means of arrival:  Comments: EMS 22 yo female asthma attack because she has a cold-wheezing/2nd duoneb with decreased wheezing HR 120 RR 22

## 2018-10-07 NOTE — ED Triage Notes (Signed)
Per EMS, Pt is presenting from home. Has had flu like symptoms for the last week. Pt felt like she had a fever. Pt took 1000 mg at apprx. 2100. Pt has rhonchi and wheezing. Received 2 albuterol treatments, 1 DuoNed treatment, and 1 Solu-Medrol treatment.

## 2018-10-07 NOTE — ED Provider Notes (Signed)
11:47 PM BP 120/73   Pulse (!) 108   Temp 98.3 F (36.8 C) (Oral)   Resp 12   Ht 5\' 2"  (1.575 m)   Wt 77.1 kg   SpO2 100%   BMI 31.09 kg/m   Patient taken in sign out from PA geiple Patient with recent Flu like illness and hx of asthma. Febrile today. Has been sick for about 1-2 weeks and worsening. Presented in respiratory distress with Asthma. Given mag, solumedrol, 2 rounds of  duoneb prior to arrival, receiving hour long neb and improving.  1:36 AM BP 127/79   Pulse (!) 115   Temp 98.3 F (36.8 C) (Oral)   Resp (!) 24   Ht 5\' 2"  (1.575 m)   Wt 77.1 kg   LMP 10/03/2018   SpO2 100%   BMI 31.09 kg/m  Patient feels greatly improved.  She has had complete resolution of her wheezing.  Her oxygen saturations are 100% on room air.  Patient appears appropriate for discharge at this time and will be discharged with a Medrol Dosepak and a new prescription for an inhaler.  I discussed return precautions and outpatient follow-up with patient who appears appropriate for discharge at this time.   Arthor CaptainHarris, Merlinda Wrubel, PA-C 10/08/18 [id number]    Paula LibraMolpus, John, MD 10/08/18 0700

## 2018-10-08 ENCOUNTER — Emergency Department (HOSPITAL_COMMUNITY): Payer: Self-pay

## 2018-10-08 LAB — BASIC METABOLIC PANEL
Anion gap: 12 (ref 5–15)
BUN: 5 mg/dL — ABNORMAL LOW (ref 6–20)
CO2: 20 mmol/L — ABNORMAL LOW (ref 22–32)
CREATININE: 0.75 mg/dL (ref 0.44–1.00)
Calcium: 8.3 mg/dL — ABNORMAL LOW (ref 8.9–10.3)
Chloride: 105 mmol/L (ref 98–111)
GFR calc Af Amer: >60 mL/min (ref >60)
GFR calc non Af Amer: >60 mL/min (ref >60)
Glucose, Bld: 124 mg/dL — ABNORMAL HIGH (ref 70–99)
Potassium: 4.1 mmol/L (ref 3.5–5.1)
Sodium: 137 mmol/L (ref 135–145)

## 2018-10-08 LAB — INFLUENZA PANEL BY PCR (TYPE A & B)
Influenza A By PCR: NEGATIVE
Influenza B By PCR: NEGATIVE

## 2018-10-08 MED ORDER — ALBUTEROL SULFATE HFA 108 (90 BASE) MCG/ACT IN AERS: 2.0000 | INHALATION_SPRAY | RESPIRATORY_TRACT | 0 refills | Status: DC | PRN

## 2018-10-08 MED ORDER — METHYLPREDNISOLONE 4 MG PO TBPK: ORAL_TABLET | ORAL | 0 refills | Status: DC

## 2018-10-08 NOTE — Discharge Instructions (Addendum)
Get help right away if: °Your peak flow reading is less than 50% of your personal best. This is in the red zone, which means "danger." °You have severe trouble breathing. °You develop chest pain or discomfort. °Your medicines no longer seem to be helping. °You vomit. °You cannot eat or drink without vomiting. °You are coughing up yellow, green, brown, or bloody mucus. °You have a fever and your symptoms suddenly get worse. °You have trouble swallowing. °You feel very tired, and breathing becomes tiring. °

## 2018-11-22 ENCOUNTER — Emergency Department (HOSPITAL_COMMUNITY)
Admission: EM | Admit: 2018-11-22 | Discharge: 2018-11-22 | Disposition: A | Discharge status: Home / Self Care | Payer: Medicaid Other | Attending: Emergency Medicine | Admitting: Emergency Medicine

## 2018-11-22 ENCOUNTER — Encounter (HOSPITAL_COMMUNITY): Payer: Self-pay

## 2018-11-22 DIAGNOSIS — H02843 Edema of right eye, unspecified eyelid: Secondary | ICD-10-CM

## 2018-11-22 DIAGNOSIS — J45909 Unspecified asthma, uncomplicated: Secondary | ICD-10-CM | POA: Insufficient documentation

## 2018-11-22 DIAGNOSIS — Z79899 Other long term (current) drug therapy: Secondary | ICD-10-CM | POA: Insufficient documentation

## 2018-11-22 NOTE — ED Provider Notes (Signed)
[facility] EMERGENCY DEPARTMENT Provider Note   CSN: [id number] Arrival date & time: 11/22/18  1409     History   Chief Complaint Chief Complaint  Patient presents with  . Facial Swelling    HPI Debra Gardner is a 23 y.o. female w PMHx asthma, seasonal allergies, presenting to the ED with complaint of intermittent right eyelid swelling x3 weeks.  Patient states symptoms initially began after she had slept at a friend's house.  States she thought it might of been allergic reaction, treated intermittently with Benadryl.  States she saw an ophthalmologist about 1 month who initially prescribed her 1 week prednisone taper and 1 week of Keflex.  States the symptoms improved, however at the end of the steroid taper her symptoms recurred.  She was seen once again at the eye doctor who prescribed her another week of Keflex and another steroid taper.  She states symptoms improved again, however when the steroid wore off her swelling recurred.  She was then prescribed a week of Bactrim and another steroid taper.  She states she is finishing the steroid and she noticed the right eyelid beginning to swell once more.  Initially treated with Benadryl at night during the first week, however not taking any antihistamines since.  She is she has a little bit of redness to the eyelid with the swelling, however no pain or itching.  Feels like her lid feels heavy.  No drainage.  No redness to her conjunctivae.  No vision changes or headache.  No fevers.  No pain with eye movement.  No photophobia.  No swelling of the lips or tongue.  No new personal products.  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  The history is provided by the patient.    Past Medical History:  Diagnosis Date  . Asthma     There are no active problems to display for this patient.   History reviewed. No pertinent surgical history.   OB History   No obstetric history on file.      Home  Medications    Prior to Admission medications   Medication Sig Start Date End Date Taking? Authorizing Provider  albuterol (PROVENTIL HFA;VENTOLIN HFA) 108 (90 Base) MCG/ACT inhaler Inhale 1-2 puffs into the lungs every 6 (six) hours as needed for wheezing or shortness of breath. 07/29/17   Khatri, Hina, PA-C  albuterol (PROVENTIL HFA;VENTOLIN HFA) 108 (90 Base) MCG/ACT inhaler Inhale 2 puffs into the lungs every 4 (four) hours as needed for wheezing or shortness of breath. 10/08/18   Harris, Abigail, PA-C  azithromycin (ZITHROMAX Z-PAK) 250 MG tablet Take 1 tablet (250 mg total) by mouth daily. Take 500mg  on day 1, take 250mg  on days 2-5 Patient not taking: Reported on 10/08/2018 07/29/17   Dietrich Pates, PA-C  beclomethasone (QVAR) 40 MCG/ACT inhaler Inhale 1 puff into the lungs 2 (two) times daily. Patient not taking: Reported on 10/08/2018 05/26/12 10/01/15  Truddie Coco, DO  beclomethasone (QVAR) 80 MCG/ACT inhaler Inhale 2 puffs into the lungs 2 (two) times daily.    [provider]  benzonatate (TESSALON) 100 MG capsule Take 1 capsule (100 mg total) by mouth 3 (three) times daily as needed for cough. Patient not taking: Reported on 10/08/2018 09/19/18   Antony Madura, PA-C  dextromethorphan-guaiFENesin [facility] DM) 30-600 MG 12hr tablet Take 1 tablet by mouth 2 (two) times daily. Patient not taking: Reported on 11/02/2016 10/01/15   Anselm Pancoast, PA-C  EPINEPHrine [facility]  2-PAK) 0.3 mg/0.3 mL IJ SOAJ injection Inject 0.3 mLs (0.3 mg total) into the muscle once as needed (for severe allergic reaction). CAll 911 immediately if you have to use this medicine 02/04/15   Piepenbrink, Victorino DikeJennifer, PA-C  famotidine (PEPCID) 20 MG tablet Take 1 tablet (20 mg total) by mouth daily. Patient not taking: Reported on 10/08/2018 03/03/17   Trixie DredgeWest, Emily, PA-C  fluticasone [facility](FLONASE) 50 MCG/ACT nasal spray Place 2 sprays into the nose daily. Patient not taking: Reported on 10/08/2018 05/26/12 10/01/15  Truddie CocoBush,  Tamika, DO  ibuprofen (ADVIL,MOTRIN) 800 MG tablet Take 1 tablet (800 mg total) by mouth 3 (three) times daily. Patient not taking: Reported on 11/02/2016 10/01/15   Anselm PancoastJoy, Shawn C, PA-C  methylPREDNISolone (MEDROL DOSEPAK) 4 MG TBPK tablet Use as directed 10/08/18   Arthor CaptainHarris, Abigail, PA-C  ondansetron (ZOFRAN ODT) 4 MG disintegrating tablet Take 1 tablet (4 mg total) by mouth every 8 (eight) hours as needed for nausea or vomiting. Patient not taking: Reported on 10/08/2018 09/19/18   Antony MaduraHumes, Kelly, PA-C  ondansetron (ZOFRAN) 4 MG tablet Take 1 tablet (4 mg total) by mouth every 8 (eight) hours as needed for nausea or vomiting. Patient not taking: Reported on 10/08/2018 03/03/17   Trixie DredgeWest, Emily, PA-C  oseltamivir (TAMIFLU) 75 MG capsule Take 1 capsule (75 mg total) by mouth every 12 (twelve) hours. Patient not taking: Reported on 10/08/2018 09/19/18   Antony MaduraHumes, Kelly, PA-C  predniSONE (DELTASONE) 20 MG tablet Take 2 tablets (40 mg total) by mouth daily. Patient not taking: Reported on 10/08/2018 06/07/18   Renne CriglerGeiple, Joshua, PA-C    Family History No family history on file.  Social History Social History   Tobacco Use  . Smoking status: Never Smoker  . Smokeless tobacco: Never Used  Substance Use Topics  . Alcohol use: No  . Drug use: No     Allergies   Dairy aid [lactase] and Wheat bran   Review of Systems Review of Systems  Constitutional: Negative for fever.  HENT: Negative for sore throat.   Eyes: Negative for photophobia, pain, discharge, redness, itching and visual disturbance.       Right eyelid swelling  Respiratory: Negative for cough and shortness of breath.   Neurological: Negative for headaches.     Physical Exam Updated Vital Signs BP 112/73 (BP Location: Right Arm)   Pulse 86   Temp 98.4 F (36.9 C) (Oral)   Resp 16   SpO2 99%   Physical Exam Vitals signs and nursing note reviewed.  Constitutional:      General: She is not in acute distress.    Appearance: She  is well-developed.  HENT:     Head: Normocephalic and atraumatic.     Mouth/Throat:     Comments: Oropharynx is clear and moist.  No swelling. Eyes:     General:        Right eye: No foreign body or discharge.        Left eye: No discharge.     Extraocular Movements: Extraocular movements intact.     Conjunctiva/sclera: Conjunctivae normal.     Right eye: Right conjunctiva is not injected. No chemosis, exudate or hemorrhage.    Pupils: Pupils are equal, round, and reactive to light.     Comments: Right upper lid is slightly swollen compared to the left.  No redness.  No tenderness.  No obvious hordeolum.  Cardiovascular:     Rate and Rhythm: Normal rate.  Pulmonary:     Effort: Pulmonary  effort is normal.  Skin:    Findings: No rash.  Neurological:     Mental Status: She is alert.  Psychiatric:        Mood and Affect: Mood normal.        Behavior: Behavior normal.      ED Treatments / Results  Labs (all labs ordered are listed, but only abnormal results are displayed) Labs Reviewed - No data to display  EKG None  Radiology No results found.  Procedures Procedures (including critical care time)  Medications Ordered in ED Medications - No data to display   Initial Impression / Assessment and Plan / ED Course  I have reviewed the triage vital signs and the nursing notes.  Pertinent labs & imaging results that were available during my care of the patient were reviewed by me and considered in my medical decision making (see chart for details).     Patient presenting with intermittent right eyelid swelling x3 weeks.  Treated with multiple courses of antibiotics and steroids by an eye doctor at [facility], however symptoms recur after steroids completed.  Does have a history of seasonal allergies and asthma.  No symptoms of anaphylaxis.  Eyes not painful, exam is very reassuring.  Low suspicion for preseptal cellulitis.  No hordeolum.  EOM is normal without pain.  No  photophobia.  Exam and presentation is most consistent with allergic etiology.  Will recommend antihistamines and warm compresses.  PCP follow-up.  Return precautions discussed.  Safe for discharge.  Patient discussed with Dr. Criss Alvine, who guided treatment agrees with care plan.  Discussed results, findings, treatment and follow up. Patient advised of return precautions. Patient verbalized understanding and agreed with plan.   Final Clinical Impressions(s) / ED Diagnoses   Final diagnoses:  Swelling of right eyelid    ED Discharge Orders    None       [place] Dani, [place] N, PA-C 11/22/18 1630    Pricilla Loveless, MD 11/23/18 1530

## 2018-11-22 NOTE — ED Notes (Signed)
Pt verbalized understanding of discharge paperwork and follow-up care.  °

## 2018-11-22 NOTE — Discharge Instructions (Addendum)
Apply warm compresses to your eye multiple times per day. It is recommended you take antihistamines daily: Take Claritin as prescribed, you can take Benadryl at night as frequently as every 6 hours. In addition, take Pepcid every 12 hours. Return to the emergency department if you experience significant eye pain, severe headache, fever, discharge from your eye, swelling of your lips or tongue, or new or worsening symptoms.

## 2018-11-22 NOTE — ED Triage Notes (Signed)
Pt presents for evaluation of R eye swelling intermittently x 3 weeks. Reports was seen 3 weeks ago by eye dr, given antibiotics and steroids. States every time she finishes the steroid taper the swelling comes back. Reports pressure behind eye. No drainage or redness.

## 2018-12-22 ENCOUNTER — Encounter (HOSPITAL_COMMUNITY): Payer: Self-pay | Admitting: Emergency Medicine

## 2018-12-22 ENCOUNTER — Ambulatory Visit (HOSPITAL_COMMUNITY)
Admission: EM | Admit: 2018-12-22 | Discharge: 2018-12-22 | Disposition: A | Discharge status: Home / Self Care | Payer: Medicaid Other | Attending: Family Medicine | Admitting: Family Medicine

## 2018-12-22 ENCOUNTER — Other Ambulatory Visit: Payer: Self-pay

## 2018-12-22 DIAGNOSIS — J4521 Mild intermittent asthma with (acute) exacerbation: Secondary | ICD-10-CM

## 2018-12-22 DIAGNOSIS — R062 Wheezing: Secondary | ICD-10-CM

## 2018-12-22 MED ORDER — ALBUTEROL SULFATE HFA 108 (90 BASE) MCG/ACT IN AERS
2.0000 | INHALATION_SPRAY | Freq: Once | RESPIRATORY_TRACT | Status: AC
  Administered 2018-12-22: 2 via RESPIRATORY_TRACT

## 2018-12-22 MED ORDER — MONTELUKAST SODIUM 10 MG PO TABS: 10.0000 mg | ORAL_TABLET | Freq: Every day | ORAL | 1 refills | Status: AC

## 2018-12-22 MED ORDER — CETIRIZINE HCL 10 MG PO TABS: 10.0000 mg | ORAL_TABLET | Freq: Every day | ORAL | 1 refills | Status: AC

## 2018-12-22 MED ORDER — PREDNISONE 50 MG PO TABS: 50.0000 mg | ORAL_TABLET | Freq: Every day | ORAL | 0 refills | Status: DC

## 2018-12-22 MED ORDER — ALBUTEROL SULFATE HFA 108 (90 BASE) MCG/ACT IN AERS
INHALATION_SPRAY | RESPIRATORY_TRACT | Status: AC
  Filled 2018-12-22: qty 6.7

## 2018-12-22 NOTE — ED Provider Notes (Signed)
[facility]    CSN: [id number] Arrival date & time: 12/22/18  1403     History   Chief Complaint Chief Complaint  Patient presents with  . Asthma    HPI Debra Gardner is a 23 y.o. female.   23 year old female comes in for 2 to 3-day history of asthma exacerbation.  States can feel her allergies starting, and start taking Benadryl.  She has had mild wheezing, but does not have her albuterol inhaler.  States was able to manage to wheezing until she went to work today and exerted picking up boxes.  She denies rhinorrhea, nasal congestion.  She has some cough at nighttime that she associates with asthma exacerbation.  Denies fever, chills, night sweats.  Never smoker.  She usually takes Zyrtec and Singulair for her seasonal allergies, but ran out as well.     Past Medical History:  Diagnosis Date  . Asthma     There are no active problems to display for this patient.   History reviewed. No pertinent surgical history.  OB History   No obstetric history on file.      Home Medications    Prior to Admission medications   Medication Sig Start Date End Date Taking? Authorizing Provider  albuterol (PROVENTIL HFA;VENTOLIN HFA) 108 (90 Base) MCG/ACT inhaler Inhale 1-2 puffs into the lungs every 6 (six) hours as needed for wheezing or shortness of breath. 07/29/17  Yes Khatri, Hina, PA-C  albuterol (PROVENTIL HFA;VENTOLIN HFA) 108 (90 Base) MCG/ACT inhaler Inhale 2 puffs into the lungs every 4 (four) hours as needed for wheezing or shortness of breath. 10/08/18  Yes Harris, Abigail, PA-C  beclomethasone (QVAR) 80 MCG/ACT inhaler Inhale 2 puffs into the lungs 2 (two) times daily.   Yes [provider]  beclomethasone (QVAR) 40 MCG/ACT inhaler Inhale 1 puff into the lungs 2 (two) times daily. Patient not taking: Reported on 10/08/2018 05/26/12 10/01/15  Truddie Coco, DO  cetirizine (ZYRTEC) 10 MG tablet Take 1 tablet (10 mg total) by mouth daily. 12/22/18   Cathie Hoops, Amy  V, PA-C  EPINEPHrine (EPIPEN 2-PAK) 0.3 mg/0.3 mL IJ SOAJ injection Inject 0.3 mLs (0.3 mg total) into the muscle once as needed (for severe allergic reaction). CAll 911 immediately if you have to use this medicine 02/04/15   Piepenbrink, Victorino Dike, PA-C  fluticasone [facility]) 50 MCG/ACT nasal spray Place 2 sprays into the nose daily. Patient not taking: Reported on 10/08/2018 05/26/12 10/01/15  Truddie Coco, DO  ibuprofen (ADVIL,MOTRIN) 800 MG tablet Take 1 tablet (800 mg total) by mouth 3 (three) times daily. Patient not taking: Reported on 11/02/2016 10/01/15   Joy, Shawn C, PA-C  montelukast (SINGULAIR) 10 MG tablet Take 1 tablet (10 mg total) by mouth at bedtime. 12/22/18   Cathie Hoops, Amy V, PA-C  predniSONE (DELTASONE) 50 MG tablet Take 1 tablet (50 mg total) by mouth daily. 12/22/18   Belinda Fisher, PA-C    Family History Family History  Problem Relation Age of Onset  . Healthy Mother   . Healthy Father     Social History Social History   Tobacco Use  . Smoking status: Never Smoker  . Smokeless tobacco: Never Used  Substance Use Topics  . Alcohol use: No  . Drug use: No     Allergies   Dairy aid [lactase] and Wheat bran   Review of Systems Review of Systems  Reason unable to perform ROS: See HPI as above.     Physical Exam Triage Vital  Signs ED Triage Vitals  Enc Vitals Group     BP 12/22/18 1416 121/66     Pulse Rate 12/22/18 1416 90     Resp 12/22/18 1416 18     Temp 12/22/18 1416 97.9 F (36.6 C)     Temp Source 12/22/18 1416 Temporal     SpO2 12/22/18 1416 100 %     Weight --      Height --      Head Circumference --      Peak Flow --      Pain Score 12/22/18 1417 7     Pain Loc --      Pain Edu? --      Excl. in GC? --    No data found.  Updated Vital Signs BP 121/66 (BP Location: Right Arm)   Pulse 90   Temp 97.9 F (36.6 C) (Temporal)   Resp 18   LMP 11/23/2018   SpO2 100%   Physical Exam Constitutional:      General: She is not in acute distress.     Appearance: She is well-developed. She is not ill-appearing, toxic-appearing or diaphoretic.  HENT:     Head: Normocephalic and atraumatic.     Right Ear: Tympanic membrane, ear canal and external ear normal. Tympanic membrane is not erythematous or bulging.     Left Ear: Tympanic membrane, ear canal and external ear normal. Tympanic membrane is not erythematous or bulging.     Nose: Nose normal.     Right Sinus: No maxillary sinus tenderness or frontal sinus tenderness.     Left Sinus: No maxillary sinus tenderness or frontal sinus tenderness.     Mouth/Throat:     Mouth: Mucous membranes are moist.     Pharynx: Oropharynx is clear. Uvula midline.  Eyes:     Conjunctiva/sclera: Conjunctivae normal.     Pupils: Pupils are equal, round, and reactive to light.  Neck:     Musculoskeletal: Normal range of motion and neck supple.  Cardiovascular:     Rate and Rhythm: Normal rate and regular rhythm.     Heart sounds: Normal heart sounds. No murmur. No friction rub. No gallop.   Pulmonary:     Effort: Pulmonary effort is normal. No accessory muscle usage, prolonged expiration, respiratory distress or retractions.     Comments: Patient speaking in full sentences without difficulty.  She has diffuse inspiratory and expiratory wheezing throughout.  Adequate air movement. Skin:    General: Skin is warm and dry.  Neurological:     Mental Status: She is alert and oriented to person, place, and time.      UC Treatments / Results  Labs (all labs ordered are listed, but only abnormal results are displayed) Labs Reviewed - No data to display  EKG None  Radiology No results found.  Procedures Procedures (including critical care time)  Medications Ordered in UC Medications  albuterol (PROVENTIL HFA;VENTOLIN HFA) 108 (90 Base) MCG/ACT inhaler 2 puff (has no administration in time range)    Initial Impression / Assessment and Plan / UC Course  I have reviewed the triage vital signs  and the nursing notes.  Pertinent labs & imaging results that were available during my care of the patient were reviewed by me and considered in my medical decision making (see chart for details).    Patient declined DuoNeb/albuterol nebulizer in office today.  Will have patient use albuterol inhaler in office prior to leaving.  Will provide Rx of prednisone,  to start of albuterol inhaler not adequately controlling her asthma.  Singulair and Zyrtec refilled for seasonal allergies.  Return precautions given.  Final Clinical Impressions(s) / UC Diagnoses   Final diagnoses:  Mild intermittent asthma with exacerbation    ED Prescriptions    Medication Sig Dispense Auth. Provider   cetirizine (ZYRTEC) 10 MG tablet Take 1 tablet (10 mg total) by mouth daily. 30 tablet Yu, Amy V, PA-C   montelukast (SINGULAIR) 10 MG tablet Take 1 tablet (10 mg total) by mouth at bedtime. 30 tablet Yu, Amy V, PA-C   predniSONE (DELTASONE) 50 MG tablet Take 1 tablet (50 mg total) by mouth daily. 5 tablet Threasa Alpha, [place] 12/22/18 1455

## 2018-12-22 NOTE — ED Triage Notes (Signed)
Pt presents to [facility] for assessment of flare up of her asthma with wheezing, shortness of breath with exertion, and chest tightness.  States she is currently out of her rescue inhaler.

## 2018-12-22 NOTE — Discharge Instructions (Signed)
Start albuterol as needed for shortness of breath, wheezing. I have refilled your singulair and zyrtec, please start for possible allergies causing symptoms. If albuterol not controlling asthma exacerbation, please fill prednisone and start. Follow up for reevaluation if symptoms not improving, worsens.

## 2019-05-16 ENCOUNTER — Emergency Department (HOSPITAL_COMMUNITY): Payer: Self-pay

## 2019-05-16 ENCOUNTER — Encounter (HOSPITAL_COMMUNITY): Payer: Self-pay

## 2019-05-16 ENCOUNTER — Emergency Department (HOSPITAL_COMMUNITY)
Admission: EM | Admit: 2019-05-16 | Discharge: 2019-05-16 | Disposition: A | Discharge status: Home / Self Care | Payer: Self-pay | Attending: Emergency Medicine | Admitting: Emergency Medicine

## 2019-05-16 DIAGNOSIS — Z3202 Encounter for pregnancy test, result negative: Secondary | ICD-10-CM | POA: Insufficient documentation

## 2019-05-16 DIAGNOSIS — J4541 Moderate persistent asthma with (acute) exacerbation: Secondary | ICD-10-CM | POA: Insufficient documentation

## 2019-05-16 DIAGNOSIS — Z79899 Other long term (current) drug therapy: Secondary | ICD-10-CM | POA: Insufficient documentation

## 2019-05-16 LAB — BASIC METABOLIC PANEL
Anion gap: 8 (ref 5–15)
BUN: 10 mg/dL (ref 6–20)
CO2: 23 mmol/L (ref 22–32)
Calcium: 9 mg/dL (ref 8.9–10.3)
Chloride: 105 mmol/L (ref 98–111)
Creatinine, Ser: 0.73 mg/dL (ref 0.44–1.00)
GFR calc Af Amer: >60 mL/min (ref >60)
GFR calc non Af Amer: >60 mL/min (ref >60)
Glucose, Bld: 117 mg/dL — ABNORMAL HIGH (ref 70–99)
Potassium: 4.1 mmol/L (ref 3.5–5.1)
Sodium: 136 mmol/L (ref 135–145)

## 2019-05-16 LAB — CBC WITH DIFFERENTIAL/PLATELET
Abs Immature Granulocytes: 0.02 10*3/uL (ref 0.00–0.07)
Basophils Absolute: 0.1 10*3/uL (ref 0.0–0.1)
Basophils Relative: 1 %
Eosinophils Absolute: 0.9 10*3/uL — ABNORMAL HIGH (ref 0.0–0.5)
Eosinophils Relative: 9 %
HCT: 43.8 % (ref 36.0–46.0)
Hemoglobin: 14.2 g/dL (ref 12.0–15.0)
Immature Granulocytes: 0 %
Lymphocytes Relative: 33 %
Lymphs Abs: 3.2 10*3/uL (ref 0.7–4.0)
MCH: 27 pg (ref 26.0–34.0)
MCHC: 32.4 g/dL (ref 30.0–36.0)
MCV: 83.4 fL (ref 80.0–100.0)
Monocytes Absolute: 0.5 10*3/uL (ref 0.1–1.0)
Monocytes Relative: 5 %
Neutro Abs: 5.1 10*3/uL (ref 1.7–7.7)
Neutrophils Relative %: 52 %
Platelets: 381 10*3/uL (ref 150–400)
RBC: 5.25 MIL/uL — ABNORMAL HIGH (ref 3.87–5.11)
RDW: 12.8 % (ref 11.5–15.5)
WBC: 9.7 10*3/uL (ref 4.0–10.5)
nRBC: 0 % (ref 0.0–0.2)

## 2019-05-16 LAB — PREGNANCY, URINE: Preg Test, Ur: NEGATIVE

## 2019-05-16 IMAGING — CR DG CHEST 2V
2 series · 2 of 2 positions shown · non-contrast
Comparison: 06/07/2018 chest radiograph.

CLINICAL DATA: Wheezing, dyspnea, fever

EXAM:
CHEST - 2 VIEW

[w chest pa]
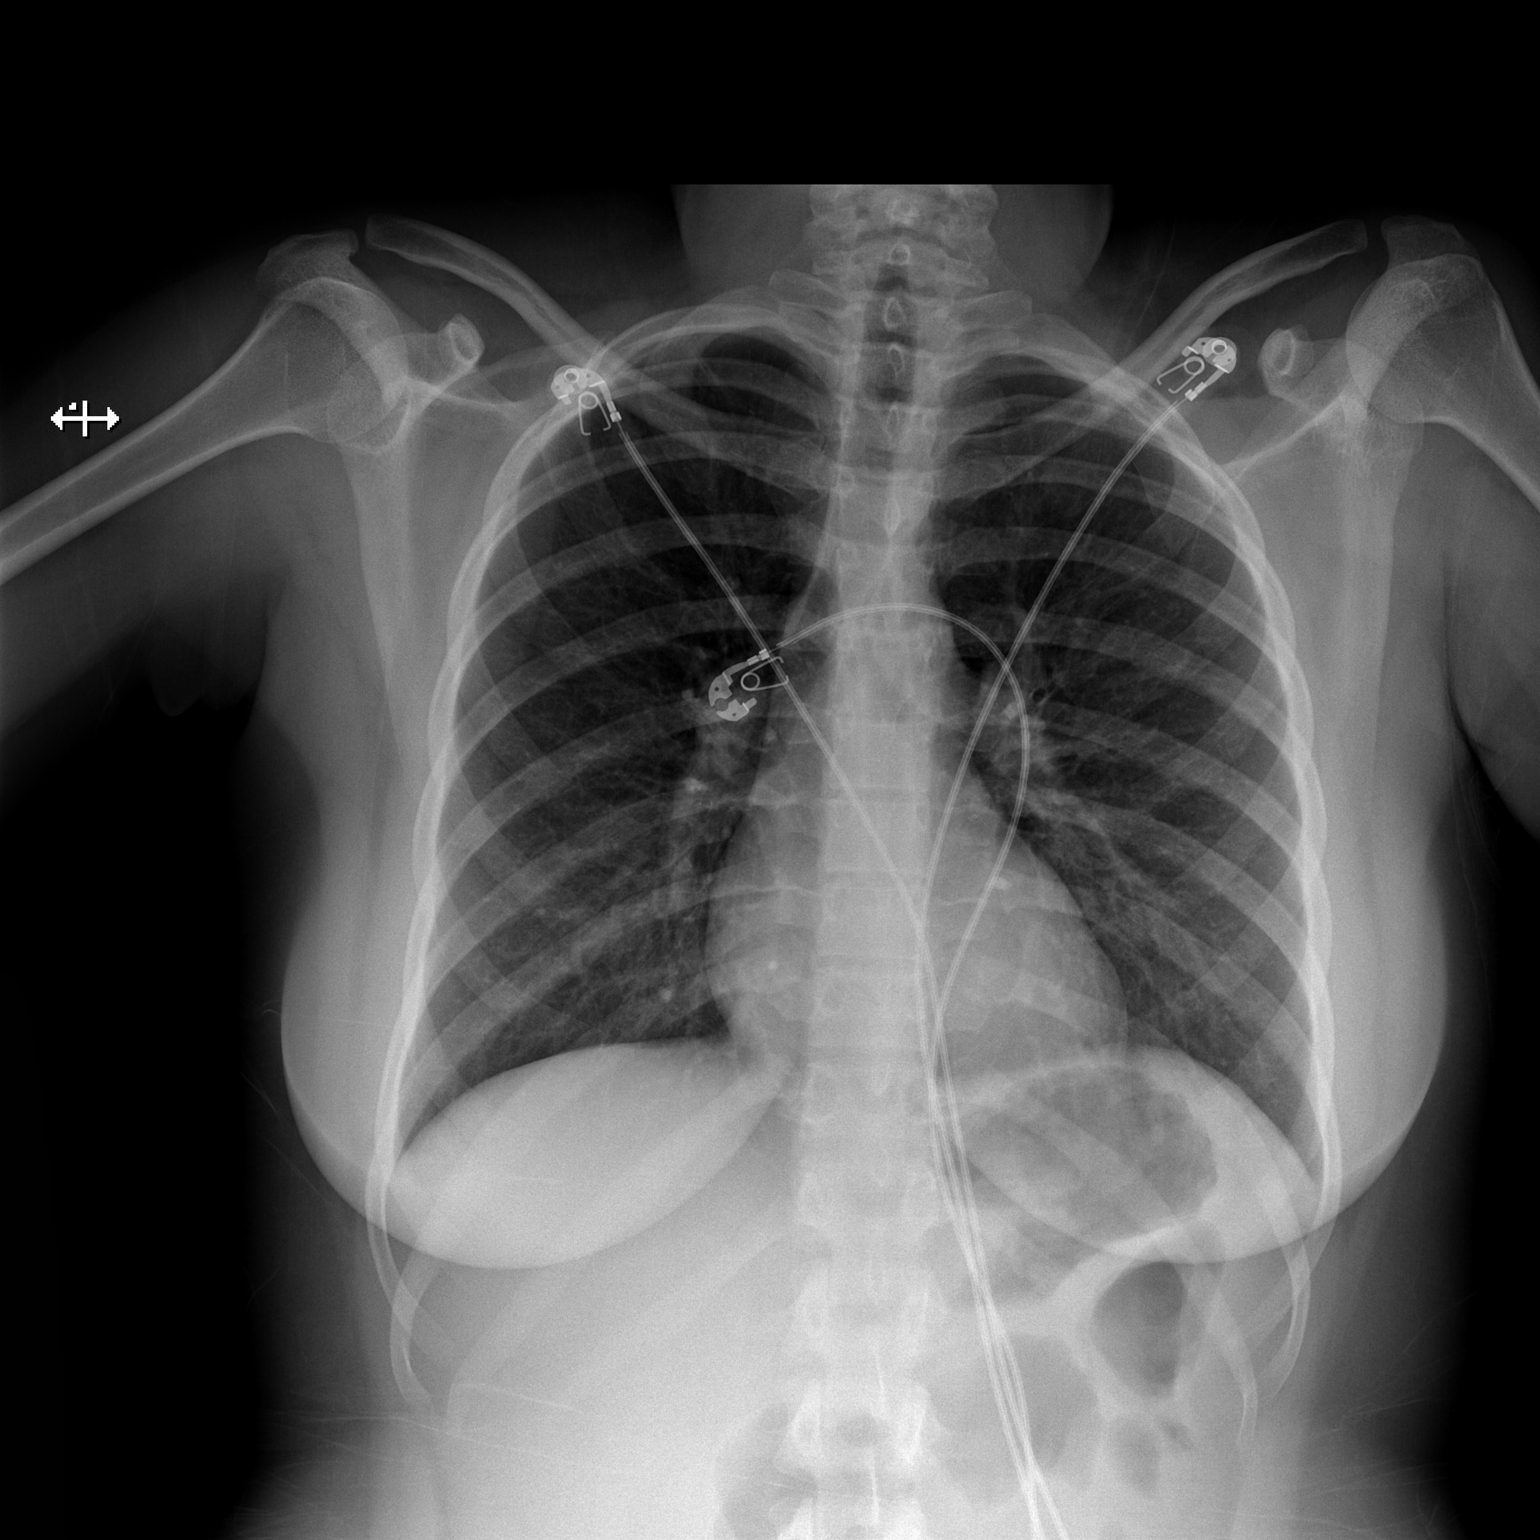

[w chest lat]
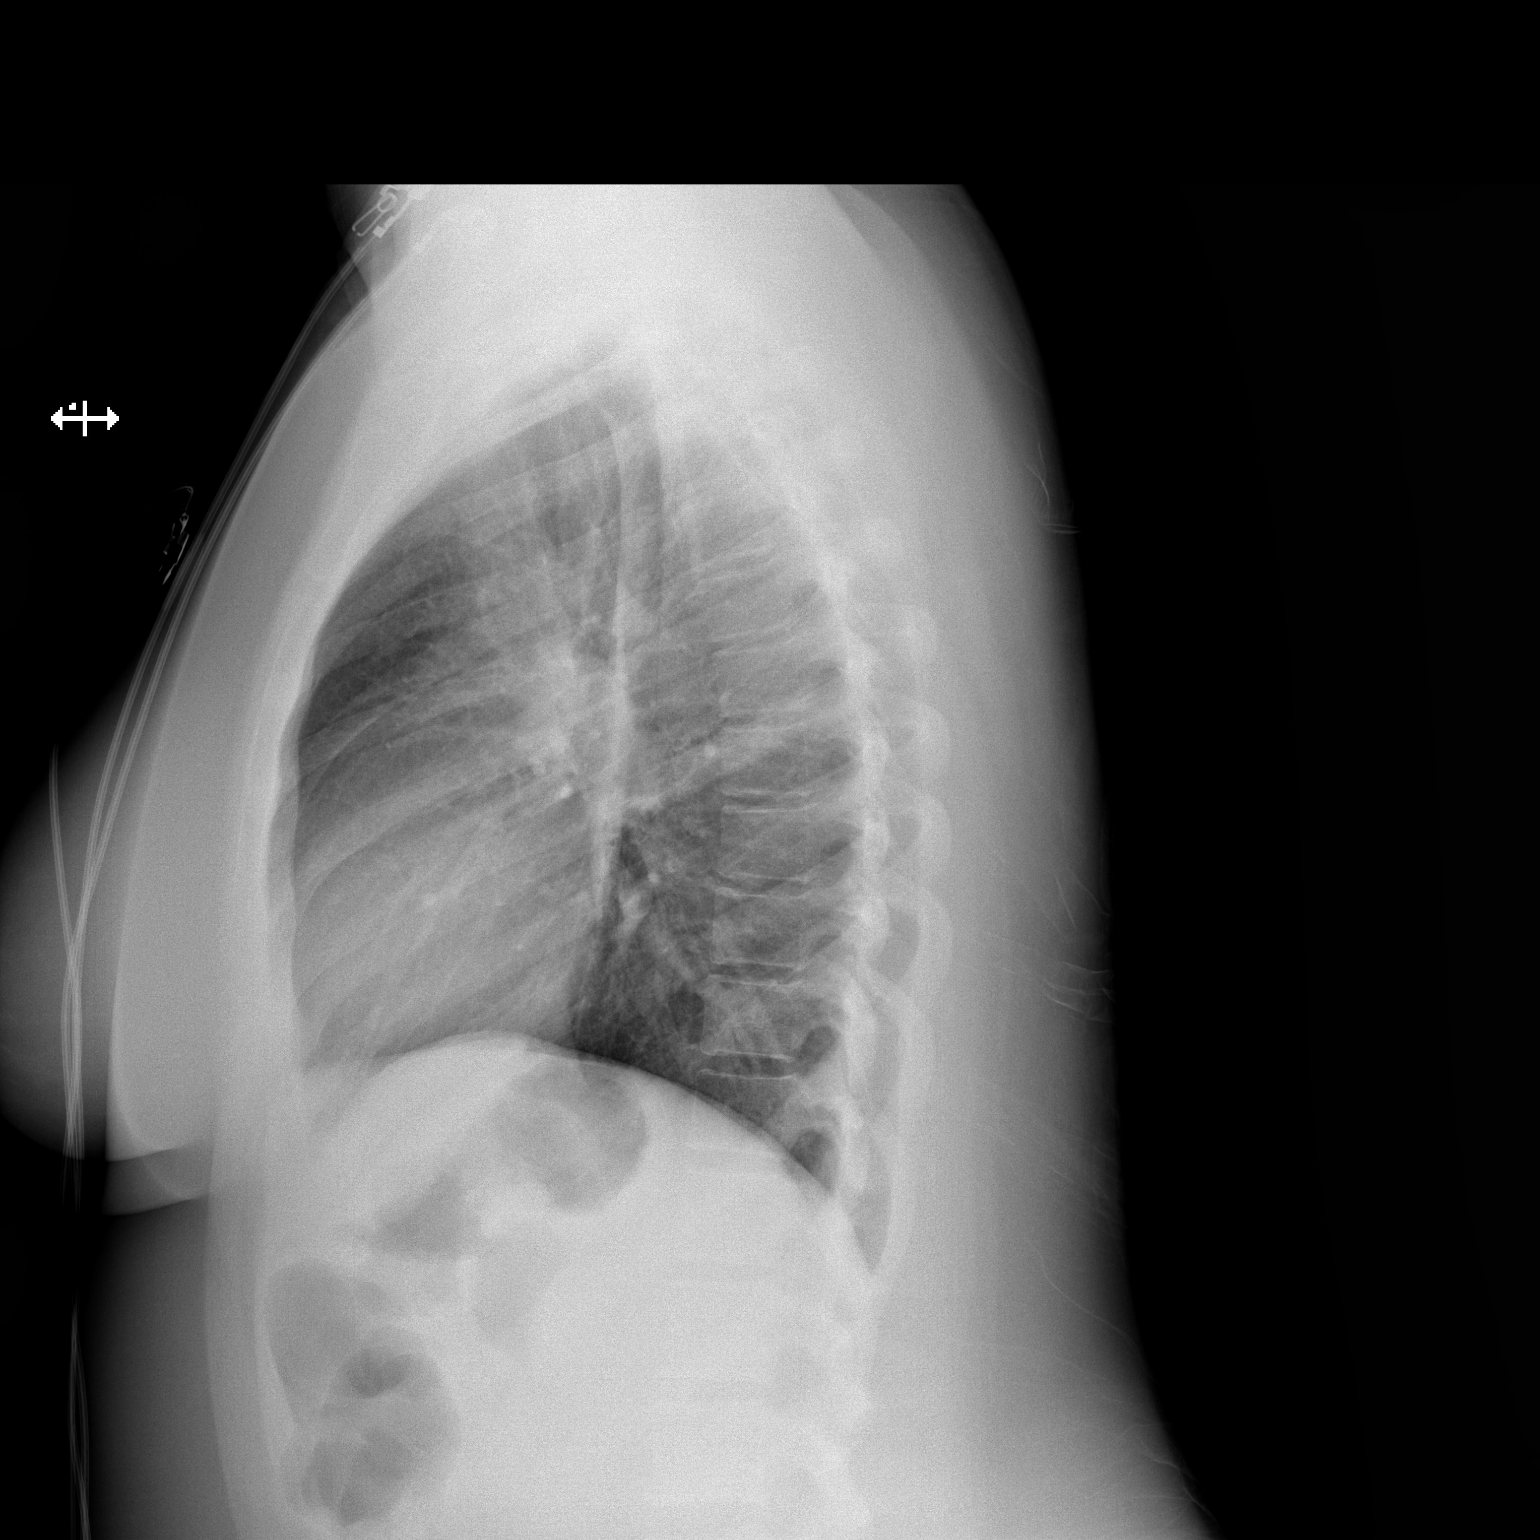

[2 of 2 positions shown; findings below may reference images not displayed]

FINDINGS: Stable cardiomediastinal silhouette with normal heart size. No
pneumothorax. No pleural effusion. Lungs appear clear, with no acute
consolidative airspace disease and no pulmonary edema.
IMPRESSION: No active cardiopulmonary disease.

## 2019-05-16 MED ORDER — PREDNISONE 20 MG PO TABS: 40.0000 mg | ORAL_TABLET | Freq: Every day | ORAL | 0 refills | Status: AC

## 2019-05-16 MED ORDER — ALBUTEROL SULFATE HFA 108 (90 BASE) MCG/ACT IN AERS: 2.0000 | INHALATION_SPRAY | Freq: Once | RESPIRATORY_TRACT | Status: DC

## 2019-05-16 MED ORDER — ALBUTEROL SULFATE HFA 108 (90 BASE) MCG/ACT IN AERS: 1.0000 | INHALATION_SPRAY | Freq: Four times a day (QID) | RESPIRATORY_TRACT | 0 refills | Status: AC | PRN

## 2019-05-16 MED ORDER — PREDNISONE 20 MG PO TABS
60.0000 mg | ORAL_TABLET | Freq: Once | ORAL | Status: AC
  Administered 2019-05-16: 21:00:00 60 mg via ORAL
  Filled 2019-05-16: qty 3

## 2019-05-16 MED ORDER — ALBUTEROL SULFATE HFA 108 (90 BASE) MCG/ACT IN AERS
6.0000 | INHALATION_SPRAY | Freq: Once | RESPIRATORY_TRACT | Status: AC
  Administered 2019-05-16: 21:00:00 6 via RESPIRATORY_TRACT
  Filled 2019-05-16: qty 6.7

## 2019-05-16 MED ORDER — IPRATROPIUM BROMIDE 0.02 % IN SOLN
2.5000 mL | Freq: Once | RESPIRATORY_TRACT | Status: AC
  Administered 2019-05-16: 21:00:00 0.5 mg via RESPIRATORY_TRACT
  Filled 2019-05-16: qty 2.5

## 2019-05-16 NOTE — Discharge Instructions (Signed)
Return for any new or worsening symptoms.

## 2019-05-16 NOTE — ED Notes (Signed)
Pt ambulatory to bathroom

## 2019-05-16 NOTE — ED Notes (Signed)
Patient verbalizes understanding of discharge instructions. Opportunity for questioning and answers were provided. Armband removed by staff, pt discharged from ED ambulatory to home.  

## 2019-05-16 NOTE — ED Notes (Addendum)
Pt approached this tech asking "how long will I have to have an asthma attack for before yall give me an inhaler". Pt appears to be in mild distress. Triage RN informed.

## 2019-05-16 NOTE — ED Notes (Signed)
Patient transported to X-ray 

## 2019-05-16 NOTE — ED Notes (Signed)
Pt ambulatory with her O2 sats remaining at 99% on RA. Pt is not in any distress at this time

## 2019-05-16 NOTE — ED Triage Notes (Signed)
Pt states that she has asthma and has been feeling SOB for the past few hours, ran out of medications, audible wheezing, no sick contacts

## 2019-05-16 NOTE — ED Provider Notes (Signed)
[facility] Provider Note   CSN: [id number] Arrival date & time: 05/16/19  1933   History   Chief Complaint Chief Complaint  Patient presents with  . Asthma  . Medication Refill    HPI Debra Gardner is a 23 y.o. female with past medical history significant for asthma who presents for evaluation of asthma exacerbation.  Patient states she has been out of her inhaler x1 week.  States she has to use her rescue inhaler approximately 1 time daily.  Has been short of breath over the last few hours.  Patient states she has had a nonproductive cough and rhinorrhea x2 days.  No known COVID exposures.  Patient states this feels similar to her previous asthma exacerbations.  Denies fever, chills, nausea, vomiting, sore throat, chest pain, boxes, abdominal pain, diarrhea, dysuria, lower extreme edema, erythema, ecchymosis or warmth.  Symptoms started gradually.  No history of PE or DVT.  Has not take anything for symptoms.  Prior hospitalizations none  History obtained from patient and past medical records.  No interpreter was used.     HPI  Past Medical History:  Diagnosis Date  . Asthma     There are no active problems to display for this patient.   History reviewed. No pertinent surgical history.   OB History   No obstetric history on file.      Home Medications    Prior to Admission medications   Medication Sig Start Date End Date Taking? Authorizing Provider  albuterol (VENTOLIN HFA) 108 (90 Base) MCG/ACT inhaler Inhale 1-2 puffs into the lungs every 6 (six) hours as needed for wheezing or shortness of breath. 05/16/19   Oz Gammel A, PA-C  beclomethasone (QVAR) 40 MCG/ACT inhaler Inhale 1 puff into the lungs 2 (two) times daily. Patient not taking: Reported on 10/08/2018 05/26/12 10/01/15  Truddie CocoBush, Tamika, DO  beclomethasone (QVAR) 80 MCG/ACT inhaler Inhale 2 puffs into the lungs 2 (two) times daily.    [provider]   cetirizine (ZYRTEC) 10 MG tablet Take 1 tablet (10 mg total) by mouth daily. 12/22/18   Cathie HoopsYu, Amy V, PA-C  EPINEPHrine (EPIPEN 2-PAK) 0.3 mg/0.3 mL IJ SOAJ injection Inject 0.3 mLs (0.3 mg total) into the muscle once as needed (for severe allergic reaction). CAll 911 immediately if you have to use this medicine 02/04/15   Piepenbrink, Victorino DikeJennifer, PA-C  fluticasone [facility](FLONASE) 50 MCG/ACT nasal spray Place 2 sprays into the nose daily. Patient not taking: Reported on 10/08/2018 05/26/12 10/01/15  Truddie CocoBush, Tamika, DO  ibuprofen (ADVIL,MOTRIN) 800 MG tablet Take 1 tablet (800 mg total) by mouth 3 (three) times daily. Patient not taking: Reported on 11/02/2016 10/01/15   Joy, Shawn C, PA-C  montelukast (SINGULAIR) 10 MG tablet Take 1 tablet (10 mg total) by mouth at bedtime. 12/22/18   Cathie HoopsYu, Amy V, PA-C  predniSONE (DELTASONE) 20 MG tablet Take 2 tablets (40 mg total) by mouth daily for 5 days. 05/16/19 05/21/19  Codi Kertz A, PA-C    Family History Family History  Problem Relation Age of Onset  . Healthy Mother   . Healthy Father     Social History Social History   Tobacco Use  . Smoking status: Never Smoker  . Smokeless tobacco: Never Used  Substance Use Topics  . Alcohol use: No  . Drug use: No     Allergies   Dairy aid [lactase] and Wheat bran   Review of Systems Review of Systems  Constitutional: Negative.  HENT: Negative.   Respiratory: Positive for cough, chest tightness and shortness of breath. Negative for apnea, choking, wheezing and stridor.   Cardiovascular: Negative.   Gastrointestinal: Negative.   Genitourinary: Negative.   Musculoskeletal: Negative.   Skin: Negative.   Neurological: Negative.   All other systems reviewed and are negative.    Physical Exam Updated Vital Signs BP 116/81   Pulse 100   Temp 98.4 F (36.9 C) (Oral)   Resp 11   SpO2 99%   Physical Exam Vitals signs and nursing note reviewed.  Constitutional:      General: She is not in acute  distress.    Appearance: She is well-developed. She is not ill-appearing, toxic-appearing or diaphoretic.  HENT:     Head: Normocephalic and atraumatic.     Nose: Nose normal.     Mouth/Throat:     Mouth: Mucous membranes are moist.     Pharynx: Oropharynx is clear.  Eyes:     Pupils: Pupils are equal, round, and reactive to light.  Neck:     Musculoskeletal: Normal range of motion.  Cardiovascular:     Rate and Rhythm: Tachycardia present.     Pulses: Normal pulses.     Heart sounds: Normal heart sounds.  Pulmonary:     Effort: No respiratory distress.     Breath sounds: No stridor. Wheezing present. No rhonchi or rales.     Comments: Diffuse wheezing bilaterally to upper lobes.  Patient stops after speaking sentence to take a breath.  No accessory muscle usage.  She is tachycardic and tachypneic. Abdominal:     General: There is no distension.     Comments: Soft, nontender without rebound or guarding.  Musculoskeletal: Normal range of motion.     Comments: Bilateral lower extremities, nontender.  No swelling, redness or warmth.  Full range of motion without difficulty.  Skin:    General: Skin is warm and dry.     Comments: No edema, erythema, ecchymosis or warmth.  Brisk capillary refill.  Neurological:     General: No focal deficit present.     Mental Status: She is alert.     Comments: No facial droop.  Ambulatory that difficulty.    ED Treatments / Results  Labs (all labs ordered are listed, but only abnormal results are displayed) Labs Reviewed  CBC WITH DIFFERENTIAL/PLATELET - Abnormal; Notable for the following components:      Result Value   RBC 5.25 (*)    Eosinophils Absolute 0.9 (*)    All other components within normal limits  BASIC METABOLIC PANEL - Abnormal; Notable for the following components:   Glucose, Bld 117 (*)    All other components within normal limits  PREGNANCY, URINE    EKG None  Radiology Dg Chest 2 View  Result Date: 05/16/2019  CLINICAL DATA:  Shortness of breath and recent asthma attack EXAM: CHEST - 2 VIEW COMPARISON:  10/08/2018 FINDINGS: The heart size and mediastinal contours are within normal limits. Both lungs are clear. The visualized skeletal structures are unremarkable. IMPRESSION: No active cardiopulmonary disease. Electronically Signed   By: Alcide CleverMark  Lukens M.D.   On: 05/16/2019 21:38    Procedures Procedures (including critical care time)  Medications Ordered in ED Medications  albuterol (VENTOLIN HFA) 108 (90 Base) MCG/ACT inhaler 2 puff (2 puffs Inhalation Not Given 05/16/19 2145)  predniSONE (DELTASONE) tablet 60 mg (60 mg Oral Given 05/16/19 2041)  albuterol (VENTOLIN HFA) 108 (90 Base) MCG/ACT inhaler 6 puff (6 puffs  Inhalation Given 05/16/19 2042)  ipratropium (ATROVENT) nebulizer solution 0.5 mg (0.5 mg Inhalation Given 05/16/19 2042)   Initial Impression / Assessment and Plan / ED Course  I have reviewed the triage vital signs and the nursing notes.  Pertinent labs & imaging results that were available during my care of the patient were reviewed by me and considered in my medical decision making (see chart for details).  24 year old female appears otherwise well presents for evaluation of asthma exacerbation.  She is afebrile however she is tachycardic and tachypneic on arrival.  She is not hypoxic.  She has diffuse expiratory wheezing throughout.  Has to stop at the end of a sentence before proceeding to take a breath.  She has no accessory muscle usage.  No evidence of PE or DVT on exam.  Has had some mild chest tightness and nonproductive cough.  No COVID exposures.  Symptoms consistent with her prior asthma exacerbations.  Labs and imaging personally reviewed.  No acute findings.  Chest x-ray without evidence of infiltrates, cardiomegaly, pneumothorax, pulmonary edema.  EKG without ST/T changes.  Significant improvement breath sounds with albuterol inhaler and steroids.  She is ambulatory in ED with  oxygen saturations greater than 99% on room air. Will refill her meds.  no current signs of respiratory distress. Lung exam improved after albuterol treatment. Prednisone given in the ED and pt will be discharged with 5 day burst. Pt states they are breathing at baseline. Pt has been instructed to continue using prescribed medications and to speak with PCP about today's exacerbation.   The patient has been appropriately medically screened and/or stabilized in the ED. I have low suspicion for any other emergent medical condition which would require further screening, evaluation or treatment in the ED or require inpatient management.  Patient is hemodynamically stable and in no acute distress.  Patient able to ambulate in department prior to ED.  Evaluation does not show acute pathology that would require ongoing or additional emergent interventions while in the emergency department or further inpatient treatment.  I have discussed the diagnosis with the patient and answered all questions.  Pain is been managed while in the emergency department and patient has no further complaints prior to discharge.  Patient is comfortable with plan discussed in room and is stable for discharge at this time.  I have discussed strict return precautions for returning to the emergency department.  Patient was encouraged to follow-up with PCP/specialist refer to at discharge.      Final Clinical Impressions(s) / ED Diagnoses   Final diagnoses:  Moderate persistent asthma with exacerbation    ED Discharge Orders         Ordered    predniSONE (DELTASONE) 20 MG tablet  Daily     05/16/19 2240    albuterol (VENTOLIN HFA) 108 (90 Base) MCG/ACT inhaler  Every 6 hours PRN     05/16/19 2240           Quintel Mccalla A, PA-C 05/16/19 2244    Pattricia Boss, MD 05/17/19 [phone]

## 2019-12-22 IMAGING — DX CHEST - 2 VIEW
2 series · 2 of 2 positions shown · non-contrast
Comparison: 10/08/2018

CLINICAL DATA: Shortness of breath and recent asthma attack

EXAM:
CHEST - 2 VIEW

[chest pa]
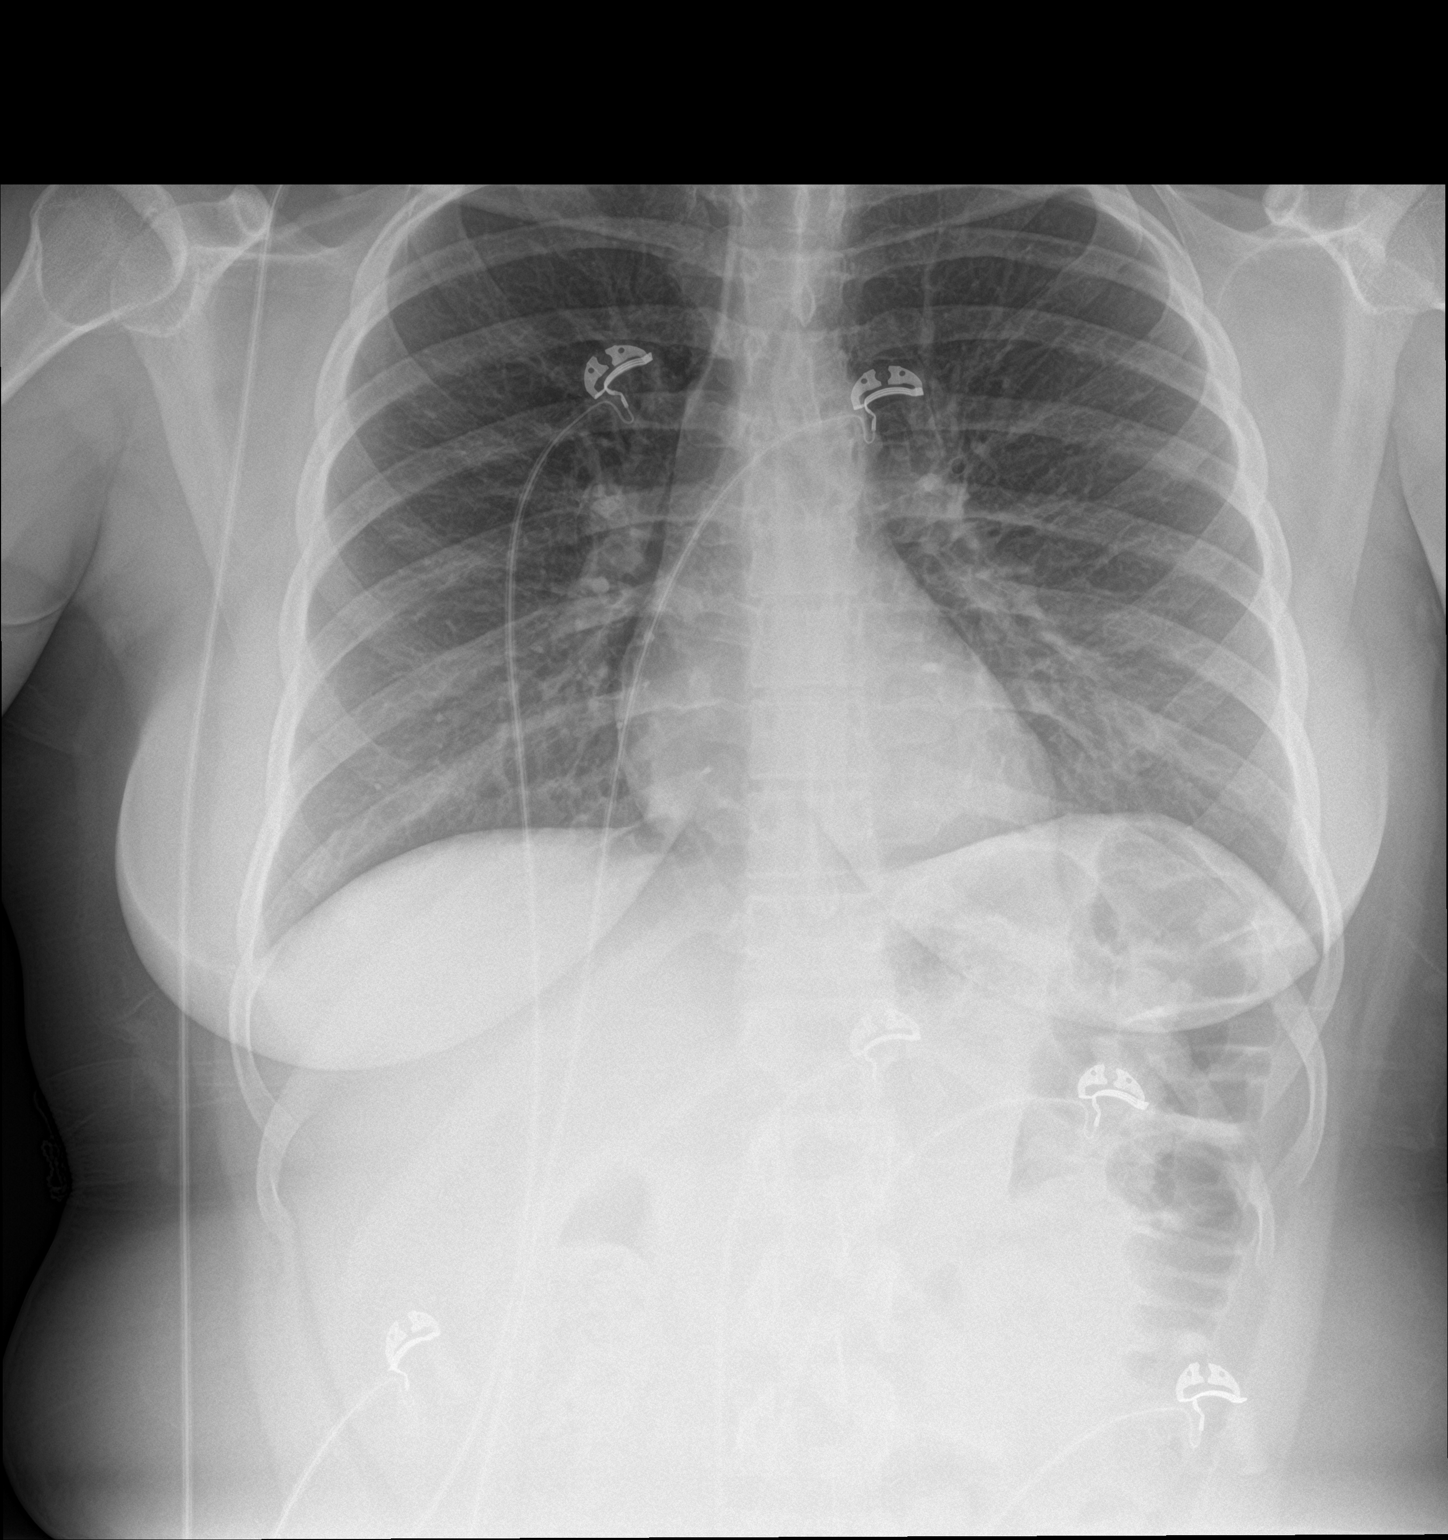

[chest lat]
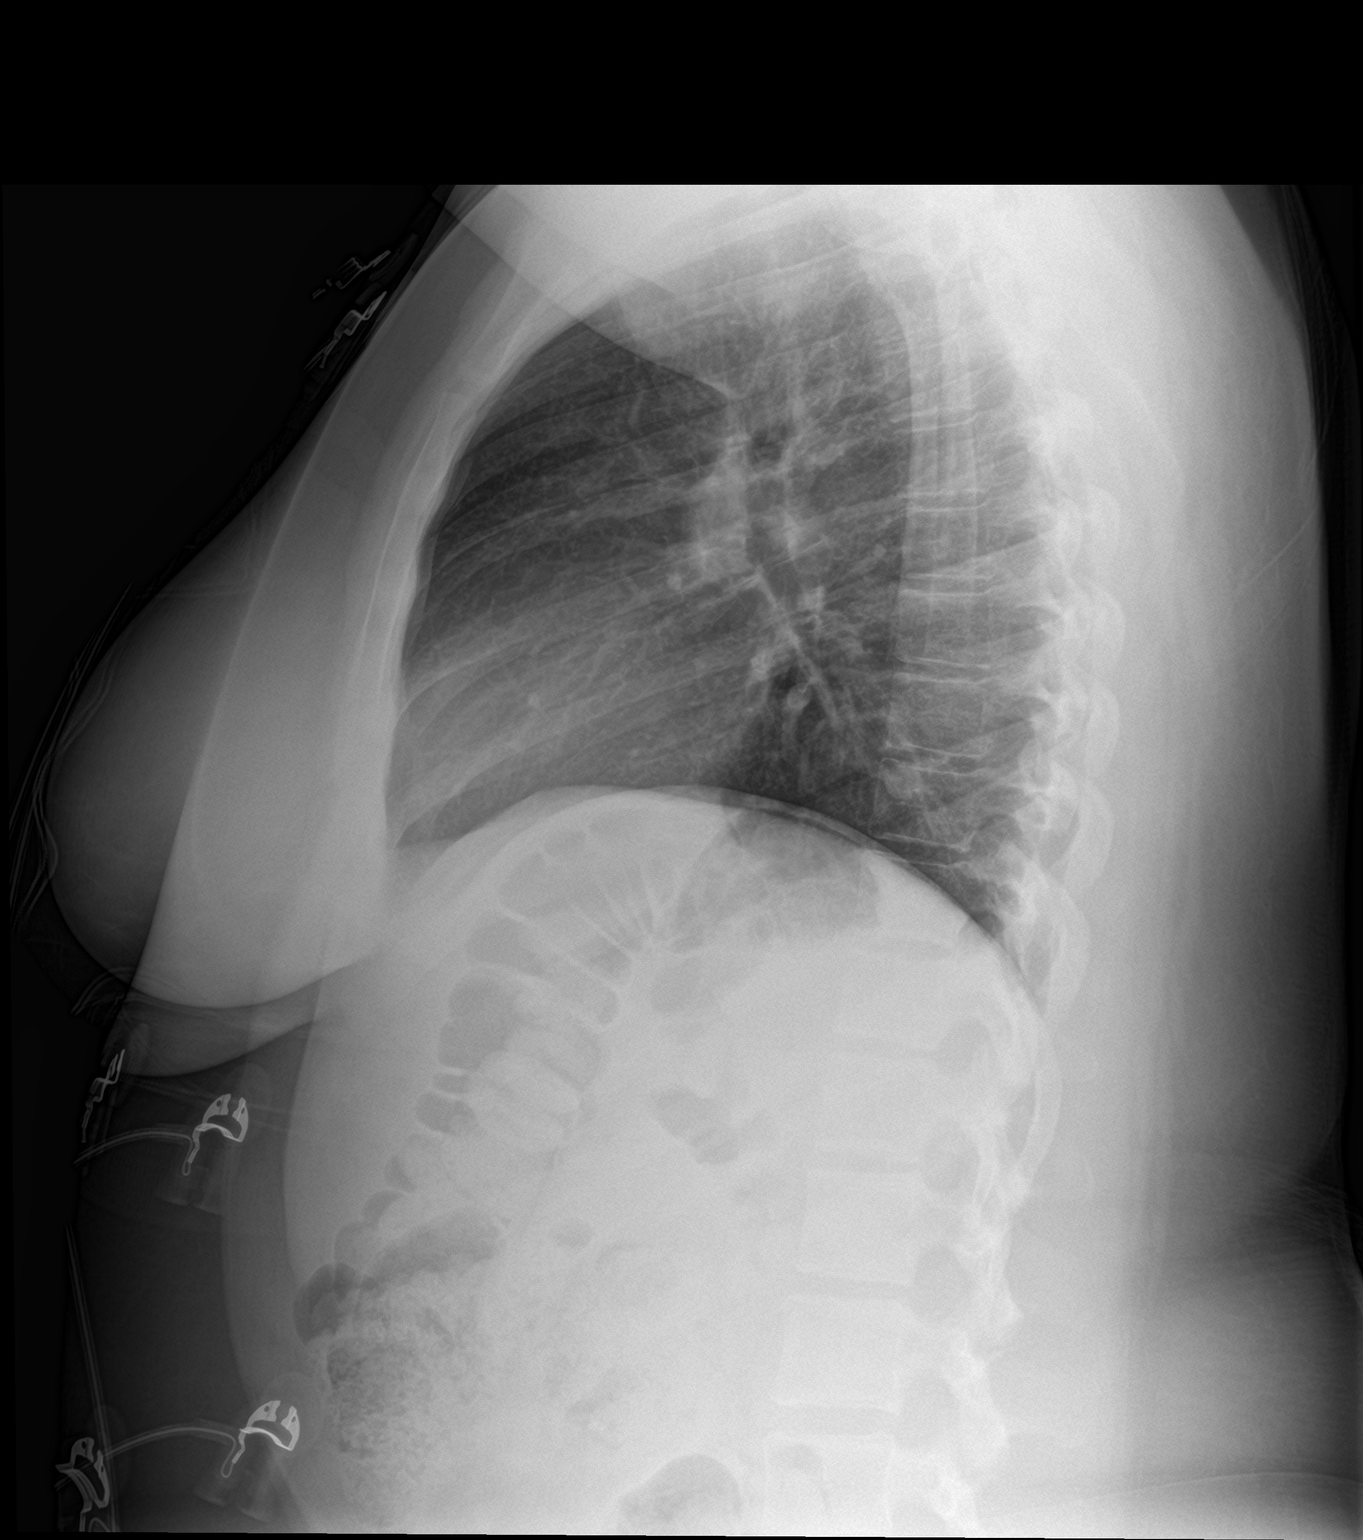

[2 of 2 positions shown; findings below may reference images not displayed]

FINDINGS: The heart size and mediastinal contours are within normal limits.
Both lungs are clear. The visualized skeletal structures are
unremarkable.
IMPRESSION: No active cardiopulmonary disease.

## 2020-11-27 ENCOUNTER — Ambulatory Visit (HOSPITAL_COMMUNITY)
Admission: EM | Admit: 2020-11-27 | Discharge: 2020-11-27 | Disposition: A | Discharge status: Home / Self Care | Payer: PRIVATE HEALTH INSURANCE | Attending: Medical Oncology | Admitting: Medical Oncology

## 2020-11-27 ENCOUNTER — Encounter (HOSPITAL_COMMUNITY): Payer: Self-pay

## 2020-11-27 ENCOUNTER — Other Ambulatory Visit: Payer: Self-pay

## 2020-11-27 DIAGNOSIS — J4541 Moderate persistent asthma with (acute) exacerbation: Secondary | ICD-10-CM

## 2020-11-27 MED ORDER — PREDNISONE 10 MG (21) PO TBPK: ORAL_TABLET | Freq: Every day | ORAL | 0 refills | Status: AC

## 2020-11-27 MED ORDER — QVAR 40 MCG/ACT IN AERS: 1.0000 | INHALATION_SPRAY | Freq: Two times a day (BID) | RESPIRATORY_TRACT | 0 refills | Status: AC
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
## 2021-01-30 ENCOUNTER — Other Ambulatory Visit: Payer: Self-pay

## 2021-01-30 ENCOUNTER — Encounter (HOSPITAL_COMMUNITY): Payer: Self-pay | Admitting: Emergency Medicine

## 2021-01-30 ENCOUNTER — Ambulatory Visit (HOSPITAL_COMMUNITY)
Admission: EM | Admit: 2021-01-30 | Discharge: 2021-01-30 | Disposition: A | Discharge status: Home / Self Care | Payer: No Typology Code available for payment source | Attending: Physician Assistant | Admitting: Physician Assistant

## 2021-01-30 DIAGNOSIS — N898 Other specified noninflammatory disorders of vagina: Secondary | ICD-10-CM | POA: Diagnosis present

## 2021-01-30 NOTE — ED Triage Notes (Signed)
Patient states that she noticed an "abnormal area" near the inside of vaginal opening after sex x 1 week ago.  The area is not painful, some swelling, possibly inflamed, size of a marble.  Patient was recently STD tested x 1 month was negative.

## 2021-01-30 NOTE — Discharge Instructions (Signed)
Your exam was normal.  Use latex free condoms.  Use hydrocortisone cream for itching on external genitalia.  Use hypoallergenic soaps and detergents and wear loosefitting cotton underwear.  Contact OB/GYN if symptoms persist or recur.

## 2021-01-30 NOTE — ED Provider Notes (Signed)
[facility]    CSN: [id number] Arrival date & time: 01/30/21  1053      History   Chief Complaint Chief Complaint  Patient presents with  . Vaginal Irritation    HPI Debra Gardner is a 25 y.o. female.   Patient presents today with a 1 week history of swelling at the vaginal introitus.  She was having vaginal irritation and so was tested for STIs approximately 1 month ago and treated for a mild case of BV.  Denies history of STI.  She believes that symptoms are related to latex condom use but continues to have irritation and some swelling despite last sexual encounter being more than 2 weeks ago.  She has not tried any over-the-counter medications for symptom management.  She denies any significant pain, bleeding, vaginal discharge, swelling of the area.  Denies history of abscess or Bartholin cyst.  She has not followed by OB/GYN.  She has no concern for pregnancy as she is currently on her menstrual cycle and took an at home pregnancy test that was negative.     Past Medical History:  Diagnosis Date  . Asthma     There are no problems to display for this patient.   History reviewed. No pertinent surgical history.  OB History   No obstetric history on file.      Home Medications    Prior to Admission medications   Medication Sig Start Date End Date Taking? Authorizing Provider  albuterol (VENTOLIN HFA) 108 (90 Base) MCG/ACT inhaler Inhale 1-2 puffs into the lungs every 6 (six) hours as needed for wheezing or shortness of breath. 05/16/19  Yes Henderly, Britni A, PA-C  beclomethasone (QVAR) 40 MCG/ACT inhaler Inhale 1 puff into the lungs 2 (two) times daily. 11/27/20 04/03/24 Yes Covington, Brand Males, PA-C  cetirizine (ZYRTEC) 10 MG tablet Take 1 tablet (10 mg total) by mouth daily. 12/22/18  Yes Yu, Amy V, PA-C  EPINEPHrine (EPIPEN 2-PAK) 0.3 mg/0.3 mL IJ SOAJ injection Inject 0.3 mLs (0.3 mg total) into the muscle once as needed (for severe allergic reaction).  CAll 911 immediately if you have to use this medicine 02/04/15  Yes Piepenbrink, Victorino Dike, PA-C  ibuprofen (ADVIL,MOTRIN) 800 MG tablet Take 1 tablet (800 mg total) by mouth 3 (three) times daily. 10/01/15  Yes Joy, Shawn C, PA-C  montelukast (SINGULAIR) 10 MG tablet Take 1 tablet (10 mg total) by mouth at bedtime. 12/22/18  Yes Yu, Amy V, PA-C  fluticasone (FLONASE) 50 MCG/ACT nasal spray Place 2 sprays into the nose daily. Patient not taking: Reported on 10/08/2018 05/26/12 10/01/15  Truddie Coco, DO  predniSONE (STERAPRED UNI-PAK 21 TAB) 10 MG (21) TBPK tablet Take by mouth daily. Take 6 tabs by mouth daily  for 2 days, then 5 tabs for 2 days, then 4 tabs for 2 days, then 3 tabs for 2 days, 2 tabs for 2 days, then 1 tab by mouth daily for 2 days 11/27/20   Rushie Chestnut, PA-C    Family History Family History  Problem Relation Age of Onset  . Healthy Mother   . Healthy Father     Social History Social History   Tobacco Use  . Smoking status: Never Smoker  . Smokeless tobacco: Never Used  Substance Use Topics  . Alcohol use: No  . Drug use: No     Allergies   Dairy aid [lactase] and Wheat bran   Review of Systems Review of Systems  Constitutional: Negative for activity  change, appetite change, fatigue and fever.  Respiratory: Negative for cough and shortness of breath.   Cardiovascular: Negative for chest pain.  Gastrointestinal: Negative for abdominal pain, diarrhea, nausea and vomiting.  Genitourinary: Negative for dysuria, flank pain, pelvic pain, urgency, vaginal bleeding, vaginal discharge and vaginal pain.  Musculoskeletal: Negative for arthralgias and myalgias.  Neurological: Negative for dizziness, light-headedness and headaches.     Physical Exam Triage Vital Signs ED Triage Vitals  Enc Vitals Group     BP 01/30/21 1121 113/68     Pulse Rate 01/30/21 1121 97     Resp --      Temp 01/30/21 1121 98.3 F (36.8 C)     Temp Source 01/30/21 1121 Oral     SpO2  01/30/21 1121 96 %     Weight --      Height --      Head Circumference --      Peak Flow --      Pain Score 01/30/21 1123 0     Pain Loc --      Pain Edu? --      Excl. in GC? --    No data found.  Updated Vital Signs BP 113/68 (BP Location: Right Arm)   Pulse 97   Temp 98.3 F (36.8 C) (Oral)   LMP 01/30/2021   SpO2 96%   Visual Acuity Right Eye Distance:   Left Eye Distance:   Bilateral Distance:    Right Eye Near:   Left Eye Near:    Bilateral Near:     Physical Exam Vitals reviewed.  Constitutional:      General: She is awake. She is not in acute distress.    Appearance: Normal appearance. She is not ill-appearing.     Comments: Very pleasant female appears stated age in no acute distress  HENT:     Head: Normocephalic and atraumatic.  Cardiovascular:     Rate and Rhythm: Normal rate and regular rhythm.     Heart sounds: No murmur heard.   Pulmonary:     Effort: Pulmonary effort is normal.     Breath sounds: Normal breath sounds. No wheezing, rhonchi or rales.     Comments: Clear to auscultation bilaterally Abdominal:     General: Bowel sounds are normal.     Palpations: Abdomen is soft.     Tenderness: There is no abdominal tenderness. There is no right CVA tenderness, left CVA tenderness, guarding or rebound.  Genitourinary:    Labia:        Right: No rash or tenderness.        Left: No rash or tenderness.      Vagina: Bleeding present.     Cervix: No cervical motion tenderness, discharge or erythema.     Comments: No enlarged Bartholin cysts or masses noted at introitus.  Menstrual blood noted posterior vaginal vault.  Normal exam. Psychiatric:        Behavior: Behavior is cooperative.      UC Treatments / Results  Labs (all labs ordered are listed, but only abnormal results are displayed) Labs Reviewed  CERVICOVAGINAL ANCILLARY ONLY    EKG   Radiology No results found.  Procedures Procedures (including critical care  time)  Medications Ordered in UC Medications - No data to display  Initial Impression / Assessment and Plan / UC Course  I have reviewed the triage vital signs and the nursing notes.  Pertinent labs & imaging results that were available during my care  of the patient were reviewed by me and considered in my medical decision making (see chart for details).      Normal exam.  Encouraged patient to avoid latex condoms as these seem to be a trigger.  She can use hydrocortisone cream on external genitalia to help manage pruritus.  Encouraged her to use hypoallergenic soaps and detergents.  NuSwab collected today-results pending.  Will defer treatment until results are obtained.  Encouraged her to follow-up with OB/GYN should symptoms persist and she was given their contact information as part of after visit summary.  Strict return precautions given to which patient expressed understanding.  Final Clinical Impressions(s) / UC Diagnoses   Final diagnoses:  Vaginal irritation     Discharge Instructions     Your exam was normal.  Use latex free condoms.  Use hydrocortisone cream for itching on external genitalia.  Use hypoallergenic soaps and detergents and wear loosefitting cotton underwear.  Contact OB/GYN if symptoms persist or recur.    ED Prescriptions    None     PDMP not reviewed this encounter.   Jeani Hawking, PA-C 01/30/21 1205

## 2021-01-31 LAB — CERVICOVAGINAL ANCILLARY ONLY
Bacterial Vaginitis (gardnerella): NEGATIVE
Candida Glabrata: NEGATIVE
Candida Vaginitis: NEGATIVE
Chlamydia: NEGATIVE
Comment: NEGATIVE
Comment: NEGATIVE
Comment: NEGATIVE
Comment: NEGATIVE
Comment: NEGATIVE
Comment: NORMAL
Neisseria Gonorrhea: NEGATIVE
Trichomonas: NEGATIVE

## 2024-10-19 ENCOUNTER — Emergency Department (HOSPITAL_COMMUNITY)

## 2024-10-19 ENCOUNTER — Emergency Department (HOSPITAL_COMMUNITY)
Admission: EM | Admit: 2024-10-19 | Discharge: 2024-10-19 | Disposition: A | Discharge status: Home / Self Care | Attending: Emergency Medicine | Admitting: Emergency Medicine

## 2024-10-19 ENCOUNTER — Other Ambulatory Visit: Payer: Self-pay

## 2024-10-19 ENCOUNTER — Encounter (HOSPITAL_COMMUNITY): Payer: Self-pay

## 2024-10-19 DIAGNOSIS — R1013 Epigastric pain: Secondary | ICD-10-CM | POA: Insufficient documentation

## 2024-10-19 DIAGNOSIS — R109 Unspecified abdominal pain: Secondary | ICD-10-CM

## 2024-10-19 DIAGNOSIS — R112 Nausea with vomiting, unspecified: Secondary | ICD-10-CM | POA: Diagnosis not present

## 2024-10-19 LAB — URINALYSIS, ROUTINE W REFLEX MICROSCOPIC
Bacteria, UA: NONE SEEN
Bilirubin Urine: NEGATIVE
Glucose, UA: NEGATIVE mg/dL
Ketones, ur: NEGATIVE mg/dL
Leukocytes,Ua: NEGATIVE
Nitrite: NEGATIVE
Protein, ur: NEGATIVE mg/dL
Specific Gravity, Urine: 1.019 (ref 1.005–1.030)
pH: 5 (ref 5.0–8.0)

## 2024-10-19 LAB — CBC WITH DIFFERENTIAL/PLATELET
Abs Immature Granulocytes: 0.03 K/uL (ref 0.00–0.07)
Basophils Absolute: 0 K/uL (ref 0.0–0.1)
Basophils Relative: 1 %
Eosinophils Absolute: 0 K/uL (ref 0.0–0.5)
Eosinophils Relative: 0 %
HCT: 46 % (ref 36.0–46.0)
Hemoglobin: 14.4 g/dL (ref 12.0–15.0)
Immature Granulocytes: 0 %
Lymphocytes Relative: 9 %
Lymphs Abs: 0.8 K/uL (ref 0.7–4.0)
MCH: 26.8 pg (ref 26.0–34.0)
MCHC: 31.3 g/dL (ref 30.0–36.0)
MCV: 85.5 fL (ref 80.0–100.0)
Monocytes Absolute: 0.4 K/uL (ref 0.1–1.0)
Monocytes Relative: 5 %
Neutro Abs: 7.4 K/uL (ref 1.7–7.7)
Neutrophils Relative %: 85 %
Platelets: 323 K/uL (ref 150–400)
RBC: 5.38 MIL/uL — ABNORMAL HIGH (ref 3.87–5.11)
RDW: 13.4 % (ref 11.5–15.5)
WBC: 8.7 K/uL (ref 4.0–10.5)
nRBC: 0 % (ref 0.0–0.2)

## 2024-10-19 LAB — COMPREHENSIVE METABOLIC PANEL WITH GFR
ALT: 12 U/L (ref 0–44)
AST: 18 U/L (ref 15–41)
Albumin: 4.6 g/dL (ref 3.5–5.0)
Alkaline Phosphatase: 136 U/L — ABNORMAL HIGH (ref 38–126)
Anion gap: 14 (ref 5–15)
BUN: 13 mg/dL (ref 6–20)
CO2: 21 mmol/L — ABNORMAL LOW (ref 22–32)
Calcium: 9.5 mg/dL (ref 8.9–10.3)
Chloride: 105 mmol/L (ref 98–111)
Creatinine, Ser: 0.76 mg/dL (ref 0.44–1.00)
GFR, Estimated: >60 mL/min
Glucose, Bld: 105 mg/dL — ABNORMAL HIGH (ref 70–99)
Potassium: 3.6 mmol/L (ref 3.5–5.1)
Sodium: 140 mmol/L (ref 135–145)
Total Bilirubin: 0.5 mg/dL (ref 0.0–1.2)
Total Protein: 8.4 g/dL — ABNORMAL HIGH (ref 6.5–8.1)

## 2024-10-19 LAB — LIPASE, BLOOD: Lipase: 21 U/L (ref 11–51)

## 2024-10-19 LAB — PREGNANCY, URINE: Preg Test, Ur: NEGATIVE

## 2024-10-19 LAB — HCG, SERUM, QUALITATIVE: Preg, Serum: NEGATIVE

## 2024-10-19 MED ORDER — ONDANSETRON 4 MG PO TBDP: 4.0000 mg | ORAL_TABLET | Freq: Three times a day (TID) | ORAL | 0 refills | Status: AC | PRN

## 2024-10-19 MED ORDER — IOHEXOL 350 MG/ML SOLN
75.0000 mL | Freq: Once | INTRAVENOUS | Status: AC | PRN
  Administered 2024-10-19: 75 mL via INTRAVENOUS

## 2024-10-19 MED ORDER — ONDANSETRON HCL 4 MG/2ML IJ SOLN
4.0000 mg | Freq: Once | INTRAMUSCULAR | Status: AC
  Administered 2024-10-19: 4 mg via INTRAVENOUS
  Filled 2024-10-19: qty 2

## 2024-10-19 MED ORDER — MORPHINE SULFATE (PF) 2 MG/ML IV SOLN
2.0000 mg | Freq: Once | INTRAVENOUS | Status: AC
  Administered 2024-10-19: 2 mg via INTRAVENOUS
  Filled 2024-10-19: qty 1

## 2024-10-19 MED ORDER — FAMOTIDINE IN NACL 20-0.9 MG/50ML-% IV SOLN
20.0000 mg | Freq: Once | INTRAVENOUS | Status: AC
  Administered 2024-10-19: 20 mg via INTRAVENOUS
  Filled 2024-10-19: qty 50

## 2024-10-19 MED ORDER — SODIUM CHLORIDE 0.9 % IV BOLUS
1000.0000 mL | Freq: Once | INTRAVENOUS | Status: AC
  Administered 2024-10-19: 1000 mL via INTRAVENOUS

## 2024-10-19 MED ORDER — LIDOCAINE VISCOUS HCL 2 % MT SOLN
15.0000 mL | Freq: Once | OROMUCOSAL | Status: AC
  Administered 2024-10-19: 15 mL via OROMUCOSAL
  Filled 2024-10-19: qty 15

## 2024-10-19 MED ORDER — MORPHINE SULFATE (PF) 2 MG/ML IV SOLN: 4.0000 mg | Freq: Once | INTRAVENOUS | Status: DC

## 2024-10-19 MED ORDER — ONDANSETRON 4 MG PO TBDP: 4.0000 mg | ORAL_TABLET | Freq: Three times a day (TID) | ORAL | 0 refills | Status: DC | PRN

## 2024-10-19 MED ORDER — ALUM & MAG HYDROXIDE-SIMETH 200-200-20 MG/5ML PO SUSP
30.0000 mL | Freq: Once | ORAL | Status: AC
  Administered 2024-10-19: 30 mL via ORAL
  Filled 2024-10-19: qty 30

## 2024-10-19 MED ORDER — KETOROLAC TROMETHAMINE 15 MG/ML IJ SOLN
15.0000 mg | Freq: Once | INTRAMUSCULAR | Status: AC
  Administered 2024-10-19: 15 mg via INTRAVENOUS
  Filled 2024-10-19: qty 1

## 2024-10-19 NOTE — Discharge Instructions (Addendum)
 Return for any problem.  ?

## 2024-10-19 NOTE — ED Provider Notes (Signed)
 " [place] [facility] Provider Note   CSN: [id number] Arrival date & time: 10/19/24  1058     Patient presents with: Abdominal Pain   MALVIKA TUNG is a 29 y.o. female.   29 year old female with prior medical history as detailed below presents for evaluation.  Patient complains of epigastric abdominal pain with associated nausea and vomiting.  Symptoms began around midnight last night.  She denies diarrhea.  She denies fever.  She reports that she is currently taking prednisone  for treatment of suspected asthma exacerbation.  She reports prior history of heartburn.  Her symptoms today are worse than her normal heartburn.  The history is provided by the patient and medical records.       Prior to Admission medications  Medication Sig Start Date End Date Taking? Authorizing Provider  albuterol  (VENTOLIN  HFA) 108 (90 Base) MCG/ACT inhaler Inhale 1-2 puffs into the lungs every 6 (six) hours as needed for wheezing or shortness of breath. 05/16/19   Henderly, Britni A, PA-C  beclomethasone (QVAR ) 40 MCG/ACT inhaler Inhale 1 puff into the lungs 2 (two) times daily. 11/27/20 04/03/24  Tonette Lauraine HERO, PA-C  cetirizine  (ZYRTEC ) 10 MG tablet Take 1 tablet (10 mg total) by mouth daily. 12/22/18   Babara, Amy V, PA-C  EPINEPHrine  (EPIPEN  2-PAK) 0.3 mg/0.3 mL IJ SOAJ injection Inject 0.3 mLs (0.3 mg total) into the muscle once as needed (for severe allergic reaction). CAll 911 immediately if you have to use this medicine 02/04/15   Piepenbrink, Delon, PA-C  fluticasone  (FLONASE ) 50 MCG/ACT nasal spray Place 2 sprays into the nose daily. Patient not taking: Reported on 10/08/2018 05/26/12 10/01/15  Levy Bone, DO  ibuprofen  (ADVIL ,MOTRIN ) 800 MG tablet Take 1 tablet (800 mg total) by mouth 3 (three) times daily. 10/01/15   Joy, Shawn C, PA-C  montelukast  (SINGULAIR ) 10 MG tablet Take 1 tablet (10 mg total) by mouth at bedtime. 12/22/18   Babara, Amy V, PA-C  predniSONE   (STERAPRED UNI-PAK 21 TAB) 10 MG (21) TBPK tablet Take by mouth daily. Take 6 tabs by mouth daily  for 2 days, then 5 tabs for 2 days, then 4 tabs for 2 days, then 3 tabs for 2 days, 2 tabs for 2 days, then 1 tab by mouth daily for 2 days 11/27/20   Tonette Lauraine HERO, PA-C    Allergies: Dairy aid [tilactase] and Wheat    Review of Systems  All other systems reviewed and are negative.   Updated Vital Signs BP 126/87   Pulse (!) 105   Temp (!) 97.4 F (36.3 C)   Resp 14   Ht 5' 2 (1.575 m)   Wt 90.7 kg   LMP 10/09/2024 (Approximate)   SpO2 96%   BMI 36.58 kg/m   Physical Exam Vitals and nursing note reviewed.  Constitutional:      General: She is not in acute distress.    Appearance: Normal appearance. She is well-developed.  HENT:     Head: Normocephalic and atraumatic.     Nose: Nose normal.     Mouth/Throat:     Mouth: Mucous membranes are moist.  Eyes:     Extraocular Movements: Extraocular movements intact.     Conjunctiva/sclera: Conjunctivae normal.     Pupils: Pupils are equal, round, and reactive to light.  Cardiovascular:     Rate and Rhythm: Normal rate and regular rhythm.     Heart sounds: No murmur heard. Pulmonary:  Effort: Pulmonary effort is normal. No respiratory distress.     Breath sounds: Normal breath sounds.  Abdominal:     General: Abdomen is flat.     Palpations: Abdomen is soft.     Tenderness: There is abdominal tenderness in the epigastric area.  Musculoskeletal:        General: No swelling. Normal range of motion.     Cervical back: Neck supple.  Skin:    General: Skin is warm and dry.     Capillary Refill: Capillary refill takes less than 2 seconds.  Neurological:     General: No focal deficit present.     Mental Status: She is alert and oriented to person, place, and time. Mental status is at baseline.  Psychiatric:        Mood and Affect: Mood normal.     (all labs ordered are listed, but only abnormal results are  displayed) Labs Reviewed  COMPREHENSIVE METABOLIC PANEL WITH GFR  LIPASE, BLOOD  URINALYSIS, ROUTINE W REFLEX MICROSCOPIC  PREGNANCY, URINE  CBC WITH DIFFERENTIAL/PLATELET    EKG: None  Radiology: No results found.   Procedures   Medications Ordered in the ED  ondansetron  (ZOFRAN ) injection 4 mg (has no administration in time range)  morphine  (PF) 2 MG/ML injection 2 mg (has no administration in time range)  alum & mag hydroxide-simeth (MAALOX/MYLANTA) 200-200-20 MG/5ML suspension 30 mL (has no administration in time range)  lidocaine  (XYLOCAINE ) 2 % viscous mouth solution 15 mL (has no administration in time range)                                    Medical Decision Making 29 year old female with nonspecific abdominal discomfort.  Exam is more suggestive of likely gastroenteritis than other pathology.  Screening labs obtained are without significant acute abnormality.  CT imaging suggest possible mesenteric adenitis.  On reevaluation the patient feels improved.  She desires discharge home.  Importance of close outpatient follow-up stressed.  Strict return precautions given and understood.  Amount and/or Complexity of Data Reviewed Labs: ordered. Radiology: ordered.  Risk OTC drugs. Prescription drug management.        Final diagnoses:  Abdominal pain, unspecified abdominal location    ED Discharge Orders          Ordered    ondansetron  (ZOFRAN -ODT) 4 MG disintegrating tablet  Every 8 hours PRN,   Status:  Discontinued        10/19/24 1841    ondansetron  (ZOFRAN -ODT) 4 MG disintegrating tablet  Every 8 hours PRN        10/19/24 1848               Laurice Maude BROCKS, MD 10/19/24 2029  "

## 2024-10-19 NOTE — ED Triage Notes (Addendum)
 Pt states having abd pain since midnight, nausea vomiting, diarrhea. Hx of asthma. Axox4.  Pt states having blood in vomit.

## 2024-10-19 NOTE — ED Notes (Signed)
 Patient transported to CT

## 2024-10-19 NOTE — ED Provider Triage Note (Signed)
 Emergency Medicine Provider Triage Evaluation Note  Debra Gardner , a 29 y.o. female  was evaluated in triage.  Pt complains of abd pain, N/V/D. Some blood tinge in vomit after repeated episodes  Review of Systems  Positive: Abd pain (mainly epigastric), N/V/D Negative: Fever, chills, SHOB, CP, cough, congestion, urinary sxs  Physical Exam  BP 126/87   Pulse (!) 105   Temp (!) 97.4 F (36.3 C)   Resp 14   SpO2 96%  Gen:   Awake, uncomfortable appearing Resp:  Normal effort  MSK:   Moves extremities without difficulty  Other:    Medical Decision Making  Medically screening exam initiated at 12:01 PM.  Appropriate orders placed.  Debra Gardner was informed that the remainder of the evaluation will be completed by another provider, this initial triage assessment does not replace that evaluation, and the importance of remaining in the ED until their evaluation is complete.  Labs and imaging ordered   Debra Gardner 10/19/24 1203

## 2024-11-05 ENCOUNTER — Ambulatory Visit

## 2024-11-10 ENCOUNTER — Ambulatory Visit
